# Patient Record
Sex: Female | Born: 1995 | Race: Black or African American | Hispanic: No | Marital: Single | Smoking: Current some day smoker
Health system: Southern US, Community
[De-identification: ages and names within clinical notes are randomized; demographics above are authoritative.]

## PROBLEM LIST (undated history)

## (undated) ENCOUNTER — Inpatient Hospital Stay (HOSPITAL_COMMUNITY): Payer: Self-pay

## (undated) DIAGNOSIS — R51 Headache: Secondary | ICD-10-CM

## (undated) HISTORY — PX: NO PAST SURGERIES: SHX2092

---
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
## 2017-05-23 ENCOUNTER — Encounter (HOSPITAL_COMMUNITY): Payer: Self-pay | Admitting: Emergency Medicine

## 2017-05-23 ENCOUNTER — Emergency Department (HOSPITAL_COMMUNITY)
Admission: EM | Admit: 2017-05-23 | Discharge: 2017-05-23 | Disposition: A | Discharge status: Home / Self Care | Payer: Medicaid Other | Attending: Emergency Medicine | Admitting: Emergency Medicine

## 2017-05-23 DIAGNOSIS — N939 Abnormal uterine and vaginal bleeding, unspecified: Secondary | ICD-10-CM

## 2017-05-23 DIAGNOSIS — F1290 Cannabis use, unspecified, uncomplicated: Secondary | ICD-10-CM | POA: Insufficient documentation

## 2017-05-23 LAB — I-STAT CHEM 8, ED
BUN: 10 mg/dL (ref 6–20)
CALCIUM ION: 1.15 mmol/L (ref 1.15–1.40)
Chloride: 103 mmol/L (ref 101–111)
Creatinine, Ser: 0.8 mg/dL (ref 0.44–1.00)
Glucose, Bld: 96 mg/dL (ref 65–99)
HCT: 36 % (ref 36.0–46.0)
Hemoglobin: 12.2 g/dL (ref 12.0–15.0)
Potassium: 3.8 mmol/L (ref 3.5–5.1)
SODIUM: 139 mmol/L (ref 135–145)
TCO2: 24 mmol/L (ref 0–100)

## 2017-05-23 LAB — CBC WITH DIFFERENTIAL/PLATELET
Basophils Absolute: 0 10*3/uL (ref 0.0–0.1)
Basophils Relative: 0 %
EOS ABS: 0.1 10*3/uL (ref 0.0–0.7)
EOS PCT: 1 %
HCT: 38.9 % (ref 36.0–46.0)
Hemoglobin: 12.9 g/dL (ref 12.0–15.0)
Lymphocytes Relative: 37 %
Lymphs Abs: 2.5 10*3/uL (ref 0.7–4.0)
MCH: 30.6 pg (ref 26.0–34.0)
MCHC: 33.2 g/dL (ref 30.0–36.0)
MCV: 92.2 fL (ref 78.0–100.0)
MONO ABS: 0.3 10*3/uL (ref 0.1–1.0)
Monocytes Relative: 5 %
Neutro Abs: 4 10*3/uL (ref 1.7–7.7)
Neutrophils Relative %: 57 %
PLATELETS: 165 10*3/uL (ref 150–400)
RBC: 4.22 MIL/uL (ref 3.87–5.11)
RDW: 12.6 % (ref 11.5–15.5)
WBC: 6.9 10*3/uL (ref 4.0–10.5)

## 2017-05-23 LAB — RPR: RPR: NONREACTIVE

## 2017-05-23 LAB — I-STAT BETA HCG BLOOD, ED (MC, WL, AP ONLY): I-stat hCG, quantitative: <5 m[IU]/mL (ref <5)

## 2017-05-23 LAB — HIV ANTIBODY (ROUTINE TESTING W REFLEX): HIV Screen 4th Generation wRfx: NONREACTIVE

## 2017-05-23 NOTE — ED Notes (Signed)
Pelvic exam completed.

## 2017-05-23 NOTE — ED Triage Notes (Signed)
Patient from home, states that she has been having vaginal bleeding since having her son in July 2017.  She states that it is a regular period, she has not had anytime since his birth that she has not had any bleeding.  She states that she is having some cramping.  States that the flow is normal.

## 2017-05-23 NOTE — ED Notes (Signed)
Patient states she is unable to urinate at this time. 

## 2017-05-23 NOTE — ED Provider Notes (Signed)
[facility] Provider Note   CSN: [id number] Arrival date & time: 05/23/17  [id number]     History   Chief Complaint Chief Complaint  Patient presents with  . Vaginal Bleeding    HPI Erica Todd is a 21 y.o. female.  HPI Patient presents with vaginal bleeding. She had her son 11 months ago and states she's had vaginal bleeding since. States it looks like a regular period. States she's been bleeding constantly but also states she does have a few days off in between. Occasional cramping. No pain. No lightheadedness or dizziness. She is not on any birth control. No fevers. No other bleeding.   Past Medical History:  Diagnosis Date  . JXBJYNWG(956.2Headache(784.0)     Patient Active Problem List   Diagnosis Date Noted  . Amniotic fluid leaking 06/26/2015  . Evaluate anatomy not seen on prior sonogram   . [redacted] weeks gestation of pregnancy   . Encounter for fetal anatomic survey   . [redacted] weeks gestation of pregnancy     Past Surgical History:  Procedure Laterality Date  . NO PAST SURGERIES      OB History    Gravida Para Term Preterm AB Living   2 1 1   1 1    SAB TAB Ectopic Multiple Live Births   1     0 1       Home Medications    Prior to Admission medications   Medication Sig Start Date End Date Taking? Authorizing Provider  ibuprofen (ADVIL,MOTRIN) 600 MG tablet Take 1 tablet (600 mg total) by mouth every 6 (six) hours. Patient not taking: Reported on 05/23/2017 06/28/15   Lynnae PrudeAllen, Daniel Landon, MD    Family History Family History  Problem Relation Age of Onset  . Alcohol abuse Neg Hx   . Arthritis Neg Hx   . Asthma Neg Hx   . Birth defects Neg Hx   . Cancer Neg Hx   . COPD Neg Hx   . Depression Neg Hx   . Diabetes Neg Hx   . Drug abuse Neg Hx   . Early death Neg Hx   . Hearing loss Neg Hx   . Heart disease Neg Hx   . Hyperlipidemia Neg Hx   . Hypertension Neg Hx   . Kidney disease Neg Hx   . Learning disabilities Neg Hx   . Mental illness Neg Hx   . Mental  retardation Neg Hx   . Miscarriages / Stillbirths Neg Hx   . Stroke Neg Hx   . Vision loss Neg Hx   . Varicose Veins Neg Hx     Social History Social History  Substance Use Topics  . Smoking status: Never Smoker  . Smokeless tobacco: Never Used  . Alcohol use No     Comment: one joint a day     Allergies   Patient has no known allergies.   Review of Systems Review of Systems  Constitutional: Negative for appetite change.  HENT: Negative for congestion.   Respiratory: Negative for shortness of breath.   Gastrointestinal: Negative for abdominal pain.  Genitourinary: Positive for vaginal bleeding. Negative for vaginal discharge and vaginal pain.  Musculoskeletal: Negative for back pain.  Neurological: Negative for seizures.  Psychiatric/Behavioral: Negative for confusion.     Physical Exam Updated Vital Signs BP 99/62   Pulse 60   Temp 98.4 F (36.9 C) (Oral)   Resp 18   LMP 05/04/2017   SpO2 100%   Physical Exam  Constitutional: She appears well-developed and well-nourished.  HENT:  Head: Atraumatic.  Eyes: EOM are normal.  Neck: Neck supple.  Cardiovascular:  Mild bradycardia.  Pulmonary/Chest: Effort normal.  Abdominal: Soft. There is no tenderness.  Genitourinary:  Genitourinary Comments: Pelvic exam showed no vaginal bleeding or abnormal vaginal discharge. No adnexal tenderness. No cervical motion tenderness.  Musculoskeletal: She exhibits no edema.  Neurological: She is alert.  Skin: Skin is warm. Capillary refill takes less than 2 seconds.  Psychiatric: She has a normal mood and affect.     ED Treatments / Results  Labs (all labs ordered are listed, but only abnormal results are displayed) Labs Reviewed  CBC WITH DIFFERENTIAL/PLATELET  RPR  HIV ANTIBODY (ROUTINE TESTING)  I-STAT CHEM 8, ED  I-STAT BETA HCG BLOOD, ED ([facility], [facility], [facility] ONLY)    EKG  EKG Interpretation None       Radiology No results found.  Procedures Procedures  (including critical care time)  Medications Ordered in ED Medications - No data to display   Initial Impression / Assessment and Plan / ED Course  I have reviewed the triage vital signs and the nursing notes.  Pertinent labs & imaging results that were available during my care of the patient were reviewed by me and considered in my medical decision making (see chart for details).     Patient    with somewhat persistent vaginal bleeding over the last year. Labs reassuring. Hemoglobin reassuring. Benign pelvic exam. Will have follow-up with GYN as needed.  Final Clinical Impressions(s) / ED Diagnoses   Final diagnoses:  Abnormal vaginal bleeding    New Prescriptions New Prescriptions   No medications on file     Benjiman Core, MD 05/23/17 1036

## 2017-05-23 NOTE — ED Notes (Signed)
Pelvic cart at bedside. 

## 2017-05-23 NOTE — ED Notes (Signed)
Patient informed we need urine specimen 

## 2018-12-19 ENCOUNTER — Encounter (HOSPITAL_COMMUNITY): Payer: Self-pay | Admitting: Emergency Medicine

## 2018-12-19 ENCOUNTER — Ambulatory Visit (HOSPITAL_COMMUNITY)
Admission: EM | Admit: 2018-12-19 | Discharge: 2018-12-19 | Disposition: A | Discharge status: Home / Self Care | Payer: Self-pay | Attending: Family Medicine | Admitting: Family Medicine

## 2018-12-19 DIAGNOSIS — N760 Acute vaginitis: Secondary | ICD-10-CM | POA: Insufficient documentation

## 2018-12-19 MED ORDER — METRONIDAZOLE 500 MG PO TABS: 500.0000 mg | ORAL_TABLET | Freq: Two times a day (BID) | ORAL | 0 refills | Status: DC

## 2018-12-19 NOTE — Discharge Instructions (Addendum)
We do metronidazole for possible bacterial vaginosis Your self swab was sent for testing we will call with any positive results.

## 2018-12-19 NOTE — ED Triage Notes (Signed)
Pt c/o vaginal discharge x3 days, states shes had BV before and feels like that.

## 2018-12-21 LAB — CERVICOVAGINAL ANCILLARY ONLY
Bacterial vaginitis: POSITIVE — AB
CANDIDA VAGINITIS: NEGATIVE
Chlamydia: NEGATIVE
Neisseria Gonorrhea: NEGATIVE
TRICH (WINDOWPATH): NEGATIVE

## 2018-12-21 NOTE — ED Provider Notes (Signed)
[facility]    CSN: [id number] Arrival date & time: 12/19/18  1211     History   Chief Complaint Chief Complaint  Patient presents with  . Vaginal Discharge    HPI Erica Todd is a 23 y.o. female.   Patient is a 23 year old female that presents today for vaginal discharge.  She reports that she has had bacterial vaginosis in the past and this feels similar.  She reports a discharge of milky white with odor.  She denies any associated fevers, chills, abdominal pain, back pain, pelvic pain.  Her symptoms have been constant and remain the same.  She has not done anything to treat her symptoms.  She is currently sexually active with one partner. Patient's last menstrual period was 12/09/2018.  She is not on any form of birth control  ROS per HPI       Past Medical History:  Diagnosis Date  . VOZDGUYQ(034.7Headache(784.0)     Patient Active Problem List   Diagnosis Date Noted  . Amniotic fluid leaking 06/26/2015  . Evaluate anatomy not seen on prior sonogram   . [redacted] weeks gestation of pregnancy   . Encounter for fetal anatomic survey   . [redacted] weeks gestation of pregnancy     Past Surgical History:  Procedure Laterality Date  . NO PAST SURGERIES      OB History    Gravida  2   Para  1   Term  1   Preterm      AB  1   Living  1     SAB  1   TAB      Ectopic      Multiple  0   Live Births  1            Home Medications    Prior to Admission medications   Medication Sig Start Date End Date Taking? Authorizing Provider  ibuprofen (ADVIL,MOTRIN) 600 MG tablet Take 1 tablet (600 mg total) by mouth every 6 (six) hours. Patient not taking: Reported on 05/23/2017 06/28/15   Lynnae PrudeAllen, Daniel Landon, MD  metroNIDAZOLE (FLAGYL) 500 MG tablet Take 1 tablet (500 mg total) by mouth 2 (two) times daily. 12/19/18   Janace ArisBast, Adalaya Irion A, NP    Family History Family History  Problem Relation Age of Onset  . Alcohol abuse Neg Hx   . Arthritis Neg Hx   . Asthma Neg Hx     . Birth defects Neg Hx   . Cancer Neg Hx   . COPD Neg Hx   . Depression Neg Hx   . Diabetes Neg Hx   . Drug abuse Neg Hx   . Early death Neg Hx   . Hearing loss Neg Hx   . Heart disease Neg Hx   . Hyperlipidemia Neg Hx   . Hypertension Neg Hx   . Kidney disease Neg Hx   . Learning disabilities Neg Hx   . Mental illness Neg Hx   . Mental retardation Neg Hx   . Miscarriages / Stillbirths Neg Hx   . Stroke Neg Hx   . Vision loss Neg Hx   . Varicose Veins Neg Hx     Social History Social History   Tobacco Use  . Smoking status: Never Smoker  . Smokeless tobacco: Never Used  Substance Use Topics  . Alcohol use: No    Comment: one joint a day  . Drug use: Yes    Frequency: 2.0 times per week  Types: Marijuana    Comment: occassionally     Allergies   Patient has no known allergies.   Review of Systems Review of Systems   Physical Exam Triage Vital Signs ED Triage Vitals  Enc Vitals Group     BP 12/19/18 1407 123/70     Pulse Rate 12/19/18 1407 89     Resp 12/19/18 1407 18     Temp 12/19/18 1407 98.2 F (36.8 C)     Temp src --      SpO2 12/19/18 1407 100 %     Weight --      Height --      Head Circumference --      Peak Flow --      Pain Score 12/19/18 1409 0     Pain Loc --      Pain Edu? --      Excl. in GC? --    No data found.  Updated Vital Signs BP 123/70   Pulse 89   Temp 98.2 F (36.8 C)   Resp 18   LMP 12/09/2018   SpO2 100%   Visual Acuity Right Eye Distance:   Left Eye Distance:   Bilateral Distance:    Right Eye Near:   Left Eye Near:    Bilateral Near:     Physical Exam Vitals signs and nursing note reviewed.  Constitutional:      General: She is not in acute distress.    Appearance: She is well-developed.  HENT:     Head: Normocephalic and atraumatic.  Eyes:     Conjunctiva/sclera: Conjunctivae normal.  Neck:     Musculoskeletal: Neck supple.  Cardiovascular:     Rate and Rhythm: Normal rate and regular  rhythm.     Heart sounds: No murmur.  Pulmonary:     Effort: Pulmonary effort is normal. No respiratory distress.     Breath sounds: Normal breath sounds.  Abdominal:     Palpations: Abdomen is soft.     Tenderness: There is no abdominal tenderness.  Genitourinary:    Comments: Deferred  Skin:    General: Skin is warm and dry.  Neurological:     Mental Status: She is alert.      UC Treatments / Results  Labs (all labs ordered are listed, but only abnormal results are displayed) Labs Reviewed  CERVICOVAGINAL ANCILLARY ONLY    EKG None  Radiology No results found.  Procedures Procedures (including critical care time)  Medications Ordered in UC Medications - No data to display  Initial Impression / Assessment and Plan / UC Course  I have reviewed the triage vital signs and the nursing notes.  Pertinent labs & imaging results that were available during my care of the patient were reviewed by me and considered in my medical decision making (see chart for details).     Based on patient's symptoms and history we will go ahead and treat for bacterial vaginosis today Self swab sent for testing Lab results pending.   Final Clinical Impressions(s) / UC Diagnoses   Final diagnoses:  Acute vaginitis     Discharge Instructions     We do metronidazole for possible bacterial vaginosis Your self swab was sent for testing we will call with any positive results.    ED Prescriptions    Medication Sig Dispense Auth. Provider   metroNIDAZOLE (FLAGYL) 500 MG tablet Take 1 tablet (500 mg total) by mouth 2 (two) times daily. 14 tablet Janace ArisBast, Greg Eckrich A, NP  Controlled Substance Prescriptions [place] Controlled Substance Registry consulted? no   Janace Aris, NP 12/21/18 [phone]

## 2019-02-04 ENCOUNTER — Encounter (HOSPITAL_COMMUNITY): Payer: Self-pay

## 2019-02-04 ENCOUNTER — Other Ambulatory Visit: Payer: Self-pay

## 2019-02-04 ENCOUNTER — Ambulatory Visit (HOSPITAL_COMMUNITY)
Admission: EM | Admit: 2019-02-04 | Discharge: 2019-02-04 | Disposition: A | Discharge status: Home / Self Care | Payer: Self-pay | Attending: Family Medicine | Admitting: Family Medicine

## 2019-02-04 DIAGNOSIS — N898 Other specified noninflammatory disorders of vagina: Secondary | ICD-10-CM | POA: Insufficient documentation

## 2019-02-04 MED ORDER — METRONIDAZOLE 500 MG PO TABS: 500.0000 mg | ORAL_TABLET | Freq: Two times a day (BID) | ORAL | 0 refills | Status: DC

## 2019-02-04 NOTE — ED Provider Notes (Signed)
[facility]   [id number] 02/04/19 Arrival Time: 2080  ASSESSMENT & PLAN:  1. Vaginal discharge    Declines vaginal cytology to test for gonorrhea and chlamydia secondary to cost. Prefers BV tx first and will f/u if not improving or worsening/new symptoms.   Discharge Instructions     You have been given the following medications today for treatment of suspected bacterial vaginosis:  Metronidazole  Even though we have treated you today, we have sent testing for sexually transmitted infections. We will notify you of any positive results once they are received. If required, we will prescribe any medications you might need.  Please refrain from all sexual activity for at least the next seven days.      Will notify of any positive results. Instructed to refrain from sexual activity for at least seven days.  Reviewed expectations re: course of current medical issues. Questions answered. Outlined signs and symptoms indicating need for more acute intervention. Patient verbalized understanding. After Visit Summary given.   SUBJECTIVE:  Erica Todd is a 23 y.o. female who presents with complaint of vaginal discharge. Onset abrupt. First noticed 2 d ago. Describes discharge as thick and green/yellow. Urinary symptoms: none. Afebrile. No abdominal or pelvic pain. No n/v. No rashes or lesions. Sexually active with single female partner. Seen here 12/19/2018; + for BV.  Patient's last menstrual period was 01/29/2019.  ROS: As per HPI.  OBJECTIVE:  Vitals:   02/04/19 0955 02/04/19 0956  BP:  (!) 103/58  Pulse:  75  Resp:  18  Temp:  98.1 F (36.7 C)  TempSrc:  Oral  SpO2:  98%  Weight: 61.2 kg     General appearance: alert, cooperative, appears stated age and no distress Throat: lips, mucosa, and tongue normal; teeth and gums normal CV: RRR Lungs: CTAB Back: no CVA tenderness; FROM at waist Abdomen: soft, non-tender GU: deferred Skin: warm and  dry Psychological: alert and cooperative; normal mood and affect.  No Known Allergies  Past Medical History:  Diagnosis Date  . Headache(784.0)    Family History  Problem Relation Age of Onset  . Alcohol abuse Neg Hx   . Arthritis Neg Hx   . Asthma Neg Hx   . Birth defects Neg Hx   . Cancer Neg Hx   . COPD Neg Hx   . Depression Neg Hx   . Diabetes Neg Hx   . Drug abuse Neg Hx   . Early death Neg Hx   . Hearing loss Neg Hx   . Heart disease Neg Hx   . Hyperlipidemia Neg Hx   . Hypertension Neg Hx   . Kidney disease Neg Hx   . Learning disabilities Neg Hx   . Mental illness Neg Hx   . Mental retardation Neg Hx   . Miscarriages / Stillbirths Neg Hx   . Stroke Neg Hx   . Vision loss Neg Hx   . Varicose Veins Neg Hx    Social History   Socioeconomic History  . Marital status: Single    Spouse name: Not on file  . Number of children: Not on file  . Years of education: Not on file  . Highest education level: Not on file  Occupational History  . Not on file  Social Needs  . Financial resource strain: Not on file  . Food insecurity:    Worry: Not on file    Inability: Not on file  . Transportation needs:    Medical: Not on  file    Non-medical: Not on file  Tobacco Use  . Smoking status: Never Smoker  . Smokeless tobacco: Never Used  Substance and Sexual Activity  . Alcohol use: No    Comment: one joint a day  . Drug use: Yes    Frequency: 2.0 times per week    Types: Marijuana    Comment: occassionally  . Sexual activity: Yes  Lifestyle  . Physical activity:    Days per week: Not on file    Minutes per session: Not on file  . Stress: Not on file  Relationships  . Social connections:    Talks on phone: Not on file    Gets together: Not on file    Attends religious service: Not on file    Active member of club or organization: Not on file    Attends meetings of clubs or organizations: Not on file    Relationship status: Not on file  . Intimate partner  violence:    Fear of current or ex partner: Not on file    Emotionally abused: Not on file    Physically abused: Not on file    Forced sexual activity: Not on file  Other Topics Concern  . Not on file  Social History Narrative  . Not on file         Mardella Layman, MD 02/04/19 1038

## 2019-02-04 NOTE — Discharge Instructions (Addendum)
You have been given the following medications today for treatment of suspected bacterial vaginosis:  Metronidazole  Even though we have treated you today, we have sent testing for sexually transmitted infections. We will notify you of any positive results once they are received. If required, we will prescribe any medications you might need.  Please refrain from all sexual activity for at least the next seven days.

## 2019-02-04 NOTE — ED Triage Notes (Signed)
Pt cc she has vaginal discharge x 2 days.

## 2019-02-04 NOTE — ED Triage Notes (Signed)
PT refused triage up front, will wait until she's in the back.

## 2019-02-05 LAB — CERVICOVAGINAL ANCILLARY ONLY
Bacterial vaginitis: POSITIVE — AB
CANDIDA VAGINITIS: NEGATIVE
CHLAMYDIA, DNA PROBE: NEGATIVE
NEISSERIA GONORRHEA: NEGATIVE
Trichomonas: NEGATIVE

## 2019-03-13 ENCOUNTER — Encounter (HOSPITAL_COMMUNITY): Payer: Self-pay | Admitting: Physician Assistant

## 2019-03-13 ENCOUNTER — Ambulatory Visit (HOSPITAL_COMMUNITY)
Admission: EM | Admit: 2019-03-13 | Discharge: 2019-03-13 | Disposition: A | Discharge status: Home / Self Care | Payer: Self-pay | Attending: Physician Assistant | Admitting: Physician Assistant

## 2019-03-13 ENCOUNTER — Other Ambulatory Visit: Payer: Self-pay

## 2019-03-13 DIAGNOSIS — N898 Other specified noninflammatory disorders of vagina: Secondary | ICD-10-CM

## 2019-03-13 MED ORDER — METRONIDAZOLE 500 MG PO TABS: 500.0000 mg | ORAL_TABLET | Freq: Two times a day (BID) | ORAL | 0 refills | Status: DC

## 2019-03-13 NOTE — ED Provider Notes (Signed)
[facility]    CSN: [id number] Arrival date & time: 03/13/19  1006     History   Chief Complaint Chief Complaint  Patient presents with  . Vaginal Discharge    HPI Erica Todd is a 23 y.o. female.   23 year old female comes in for few day history of vaginal discharge with odor.  Patient states she was diagnosed with BV, given Flagyl with good relief.  States she had unprotected intercourse prior to finishing treatment.  She denies vaginal bleeding, itching.  Denies fever, chills, night sweats.  Denies urinary symptoms such as frequency, dysuria, hematuria.  States has intermittent abdominal cramping, good relief after bowel movement. Denies nausea, vomiting. LMP 02/21/2019.     Past Medical History:  Diagnosis Date  . [id number](478.2Headache(784.0)     Patient Active Problem List   Diagnosis Date Noted  . Amniotic fluid leaking 06/26/2015  . Evaluate anatomy not seen on prior sonogram   . [redacted] weeks gestation of pregnancy   . Encounter for fetal anatomic survey   . [redacted] weeks gestation of pregnancy     Past Surgical History:  Procedure Laterality Date  . NO PAST SURGERIES      OB History    Gravida  2   Para  1   Term  1   Preterm      AB  1   Living  1     SAB  1   TAB      Ectopic      Multiple  0   Live Births  1            Home Medications    Prior to Admission medications   Medication Sig Start Date End Date Taking? Authorizing Provider  ibuprofen (ADVIL,MOTRIN) 600 MG tablet Take 1 tablet (600 mg total) by mouth every 6 (six) hours. Patient not taking: Reported on 05/23/2017 06/28/15   Lynnae PrudeAllen, Daniel Landon, MD  metroNIDAZOLE (FLAGYL) 500 MG tablet Take 1 tablet (500 mg total) by mouth 2 (two) times daily. 03/13/19   Belinda FisherYu, Safina Huard V, PA-C    Family History Family History  Problem Relation Age of Onset  . Alcohol abuse Neg Hx   . Arthritis Neg Hx   . Asthma Neg Hx   . Birth defects Neg Hx   . Cancer Neg Hx   . COPD Neg Hx   . Depression  Neg Hx   . Diabetes Neg Hx   . Drug abuse Neg Hx   . Early death Neg Hx   . Hearing loss Neg Hx   . Heart disease Neg Hx   . Hyperlipidemia Neg Hx   . Hypertension Neg Hx   . Kidney disease Neg Hx   . Learning disabilities Neg Hx   . Mental illness Neg Hx   . Mental retardation Neg Hx   . Miscarriages / Stillbirths Neg Hx   . Stroke Neg Hx   . Vision loss Neg Hx   . Varicose Veins Neg Hx     Social History Social History   Tobacco Use  . Smoking status: Never Smoker  . Smokeless tobacco: Never Used  Substance Use Topics  . Alcohol use: No    Comment: one joint a day  . Drug use: Yes    Frequency: 2.0 times per week    Types: Marijuana    Comment: occassionally     Allergies   Patient has no known allergies.   Review of Systems Review of Systems  Reason unable to perform ROS: See HPI as above.     Physical Exam Triage Vital Signs ED Triage Vitals  Enc Vitals Group     BP 03/13/19 1014 112/60     Pulse Rate 03/13/19 1014 60     Resp 03/13/19 1014 16     Temp 03/13/19 1014 98.9 F (37.2 C)     Temp Source 03/13/19 1014 Oral     SpO2 03/13/19 1014 100 %     Weight --      Height --      Head Circumference --      Peak Flow --      Pain Score 03/13/19 1016 0     Pain Loc --      Pain Edu? --      Excl. in GC? --    No data found.  Updated Vital Signs BP 112/60 (BP Location: Right Arm)   Pulse 60   Temp 98.9 F (37.2 C) (Oral)   Resp 16   LMP 02/21/2019   SpO2 100%   Physical Exam Constitutional:      General: She is not in acute distress.    Appearance: She is well-developed. She is not ill-appearing, toxic-appearing or diaphoretic.  HENT:     Head: Normocephalic and atraumatic.  Eyes:     Conjunctiva/sclera: Conjunctivae normal.     Pupils: Pupils are equal, round, and reactive to light.  Cardiovascular:     Rate and Rhythm: Normal rate and regular rhythm.     Heart sounds: Normal heart sounds. No murmur. No friction rub. No gallop.    Pulmonary:     Effort: Pulmonary effort is normal.     Breath sounds: Normal breath sounds. No wheezing or rales.  Abdominal:     General: Bowel sounds are normal.     Palpations: Abdomen is soft.     Tenderness: There is no abdominal tenderness. There is no right CVA tenderness, left CVA tenderness, guarding or rebound.  Skin:    General: Skin is warm and dry.  Neurological:     Mental Status: She is alert and oriented to person, place, and time.  Psychiatric:        Behavior: Behavior normal.        Judgment: Judgment normal.      UC Treatments / Results  Labs (all labs ordered are listed, but only abnormal results are displayed) Labs Reviewed - No data to display  EKG None  Radiology No results found.  Procedures Procedures (including critical care time)  Medications Ordered in UC Medications - No data to display  Initial Impression / Assessment and Plan / UC Course  I have reviewed the triage vital signs and the nursing notes.  Pertinent labs & imaging results that were available during my care of the patient were reviewed by me and considered in my medical decision making (see chart for details).    Patient was treated empirically for BV. Flagyl as directed.  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  Patient to refrain from sexual activity for the next 7 days. Return precautions given.   Final Clinical Impressions(s) / UC Diagnoses   Final diagnoses:  Vaginal discharge   ED Prescriptions    Medication Sig Dispense Auth. Provider   metroNIDAZOLE (FLAGYL) 500 MG tablet Take 1 tablet (500 mg total) by mouth 2 (two) times daily. 14 tablet Belinda Fisher, PA-C  Belinda Fisher, PA-C 03/13/19 1046

## 2019-03-13 NOTE — Discharge Instructions (Signed)
You were treated empirically for BV. Start flagyl as directed. Refrain from sexual activity and alcohol use for the next 7 days. Monitor for any worsening of symptoms, fever, abdominal pain, nausea, vomiting, to follow up for reevaluation. 

## 2019-03-13 NOTE — ED Notes (Signed)
Patient verbalizes understanding of discharge instructions. Opportunity for questioning and answers were provided. Patient discharged from [facility] by provider.  

## 2019-03-13 NOTE — ED Triage Notes (Signed)
Per pt she was dx here about 1 month a go with bacterial vaginosis and had sex again before treatment was completed and started having another smell and discharge. Some abdominal cramping. No Bleeding. No fevers

## 2019-04-04 ENCOUNTER — Other Ambulatory Visit: Payer: Self-pay

## 2019-04-04 ENCOUNTER — Emergency Department (HOSPITAL_COMMUNITY)
Admission: EM | Admit: 2019-04-04 | Discharge: 2019-04-04 | Discharge status: Against Medical Advice | Payer: Self-pay | Attending: Emergency Medicine | Admitting: Emergency Medicine

## 2019-04-04 ENCOUNTER — Encounter (HOSPITAL_COMMUNITY): Payer: Self-pay

## 2019-04-04 DIAGNOSIS — Z5321 Procedure and treatment not carried out due to patient leaving prior to being seen by health care provider: Secondary | ICD-10-CM | POA: Insufficient documentation

## 2019-04-04 LAB — URINALYSIS, ROUTINE W REFLEX MICROSCOPIC
Bilirubin Urine: NEGATIVE
Glucose, UA: NEGATIVE mg/dL
Hgb urine dipstick: NEGATIVE
Ketones, ur: NEGATIVE mg/dL
Leukocytes,Ua: NEGATIVE
Nitrite: NEGATIVE
Protein, ur: NEGATIVE mg/dL
Specific Gravity, Urine: 1.024 (ref 1.005–1.030)
pH: 6 (ref 5.0–8.0)

## 2019-04-04 MED ORDER — SODIUM CHLORIDE 0.9% FLUSH: 3.0000 mL | Freq: Once | INTRAVENOUS | Status: DC

## 2019-04-04 NOTE — ED Notes (Signed)
Pt refused to have labs drawn.

## 2019-04-04 NOTE — ED Triage Notes (Signed)
Onset earlier today LLQ abd pain, constant. Last BM yesterday-normal.  Usually has BM qd.

## 2019-12-03 NOTE — L&D Delivery Note (Signed)
Delivery Note At 2:22 AM a viable and healthy female was delivered via Vaginal, Spontaneous (Presentation:  Right Occiput Anterior).  APGAR: 9, 9; weight pending .   Placenta status: Spontaneous, Intact.  Cord: 3 vessels with the following complications: none .    Anesthesia: Epidural Episiotomy: None Lacerations: None Suture Repair: n/a Est. Blood Loss (mL): 50  Mom to postpartum.  Baby to Couplet care / Skin to Skin.  Erica Todd is a 24 y.o. female [phone] with IUP at [redacted]w[redacted]d admitted for IOL for IUGR.  She progressed with augmentation to complete and pushed less than 20 minutes to deliver.  Cord clamping delayed by 1-3 minutes then clamped by CNM and cut by  FOB.  Placenta intact and spontaneous, bleeding minimal.  Intact perineum without repair.  Mom and baby stable prior to transfer to postpartum. She plans on formula feeding. She is undecided about birth control.    [name] 08/06/2020, 3:04 AM

## 2020-03-16 LAB — OB RESULTS CONSOLE HIV ANTIBODY (ROUTINE TESTING): HIV: NONREACTIVE

## 2020-03-16 LAB — OB RESULTS CONSOLE GC/CHLAMYDIA
Chlamydia: NEGATIVE
Gonorrhea: NEGATIVE

## 2020-03-16 LAB — OB RESULTS CONSOLE HGB/HCT, BLOOD
HCT: 38 (ref 29–41)
Hemoglobin: 12.3

## 2020-03-16 LAB — CYTOLOGY - PAP: Pap: NEGATIVE

## 2020-03-16 LAB — CULTURE, OB URINE

## 2020-03-16 LAB — OB RESULTS CONSOLE PLATELET COUNT: Platelets: 284

## 2020-03-16 LAB — OB RESULTS CONSOLE RUBELLA ANTIBODY, IGM: Rubella: IMMUNE

## 2020-03-24 ENCOUNTER — Other Ambulatory Visit (HOSPITAL_COMMUNITY): Payer: Self-pay | Admitting: Obstetrics & Gynecology

## 2020-03-24 DIAGNOSIS — Z3A19 19 weeks gestation of pregnancy: Secondary | ICD-10-CM

## 2020-03-24 DIAGNOSIS — Z363 Encounter for antenatal screening for malformations: Secondary | ICD-10-CM

## 2020-03-29 ENCOUNTER — Ambulatory Visit (HOSPITAL_COMMUNITY): Payer: Self-pay

## 2020-03-29 ENCOUNTER — Ambulatory Visit (HOSPITAL_COMMUNITY): Admission: RE | Admit: 2020-03-29 | Payer: Self-pay | Source: Ambulatory Visit

## 2020-04-07 ENCOUNTER — Encounter: Payer: Self-pay | Admitting: *Deleted

## 2020-04-11 ENCOUNTER — Other Ambulatory Visit: Payer: Self-pay

## 2020-04-11 ENCOUNTER — Ambulatory Visit (HOSPITAL_COMMUNITY): Payer: Medicaid Other | Attending: Obstetrics & Gynecology

## 2020-04-11 ENCOUNTER — Other Ambulatory Visit: Payer: Self-pay | Admitting: *Deleted

## 2020-04-11 ENCOUNTER — Ambulatory Visit: Payer: Self-pay | Admitting: Genetic Counselor

## 2020-04-11 ENCOUNTER — Ambulatory Visit: Payer: Medicaid Other | Admitting: *Deleted

## 2020-04-11 ENCOUNTER — Ambulatory Visit (HOSPITAL_BASED_OUTPATIENT_CLINIC_OR_DEPARTMENT_OTHER): Payer: Medicaid Other | Admitting: Genetic Counselor

## 2020-04-11 VITALS — BP 119/65 | HR 72 | Temp 97.4°F

## 2020-04-11 DIAGNOSIS — Z3A19 19 weeks gestation of pregnancy: Secondary | ICD-10-CM | POA: Diagnosis present

## 2020-04-11 DIAGNOSIS — Z3A20 20 weeks gestation of pregnancy: Secondary | ICD-10-CM

## 2020-04-11 DIAGNOSIS — Z315 Encounter for genetic counseling: Secondary | ICD-10-CM

## 2020-04-11 DIAGNOSIS — Z362 Encounter for other antenatal screening follow-up: Secondary | ICD-10-CM

## 2020-04-11 DIAGNOSIS — Z363 Encounter for antenatal screening for malformations: Secondary | ICD-10-CM | POA: Insufficient documentation

## 2020-04-11 DIAGNOSIS — O0990 Supervision of high risk pregnancy, unspecified, unspecified trimester: Secondary | ICD-10-CM

## 2020-04-11 NOTE — Progress Notes (Signed)
04/11/2020  Erica Todd 11-25-1996 MRN: [id number] DOV: 04/11/2020  Erica Todd presented to the [facility] for a genetics consultation regarding advanced paternal age. Erica Todd came to her appointment alone due to COVID-19 visitor restrictions.   Indication for genetic counseling - Advanced paternal age  Prenatal history  Erica Todd is a [id number], 24 y.o. female. Her current pregnancy has completed [redacted]w[redacted]d (Estimated Date of Delivery: 08/26/20). Erica Todd has a healthy four year old son from a prior relationship. She also had a spontaneous miscarriage at 5 weeks' gestation with her son's father. Erica Todd had a previous elective termination of pregnancy with the same partner as the current fetus.  Erica Todd denied exposure to environmental toxins or chemical agents. She denied the use of alcohol, tobacco or street drugs. She denied significant viral illnesses, fevers, and bleeding during the course of her pregnancy. Her medical and surgical histories were noncontributory.  Family History  A three generation pedigree was drafted and reviewed. Both family histories were reviewed and found to be noncontributory for birth defects, intellectual disability, recurrent pregnancy loss, and known genetic conditions. Erica Todd had limited information about the father of the pregnancy's family history; thus, risk assessment was limited.  The patient's ethnicity is African American. The father of the pregnancy's ethnicity is African American. Ashkenazi Jewish ancestry and consanguinity were denied. Pedigree will be scanned under Media.  Discussion  Erica Todd was referred for genetic counseling due to advanced paternal age, as the father of the pregnancy will be 108 years old at the time of delivery.  Advanced paternal age:  Advanced paternal age (APA) is defined as greater than 38 years old at time of conception. We discussed that the chance of de novo (new) mutations  at the gene level in sperm cells increases with paternal age. For this reason, autosomal dominant single gene disorders are most strongly associated with APA. Fetuses of APA fathers have a 0.3-1% overall chance of being affected by a single gene disorder.  In addition to single gene disorders, APA is associated with an increased chance for obstetrical complications, such as hypertension, placental abruption, and preterm delivery. Some studies suggest that APA may also be associated with an increased risk for congenital anomalies, such as congenital heart defects. Schizophrenia, bipolar disorder, autism, and some forms of childhood cancer may also be associated with APA, though these risks are less well-understood Ella Jubilee et al., 2008).  We discussed an available screening option for some single gene disorders associated with APA called [facility]. [facility]ara utilizes noninvasive prenatal screening (NIPS) to screena pregnancyfor 25 autosomal or X-linked dominant single gene disorders. These include conditions associated with advanced paternal age, such as Noonan syndrome, skeletal dysplasias, and craniosynostoses. We briefly reviewed features of these conditions. [facility] is NOT diagnostic, and a positive result requires confirmation by CVS or amniocentesis. A negative test result does not rule out all single gene disorders. Erica Todd declined Javier Docker single gene NIPS today.  Ultrasound:  A complete ultrasound was performed today prior to our visit. The ultrasound report will be sent under separate cover. There were no visualized fetal anomalies or markers suggestive of aneuploidy. We discussed that fetuses with some of the single gene disorders associated with APA may demonstrate signs of the condition on ultrasound. However, ultrasound cannot identify all fetuses with these conditions. Erica Todd understands this limitation.  Carrier screening:  Per ACOG recommendation, carrier screening for cystic  fibrosis (CF), spinal muscular atrophy (SMA), and hemoglobinopathies was  discussed including information about the conditions, rationale for testing, autosomal recessive inheritance, and the option of prenatal diagnosis. Erica Todd previously had a negative hemoglobin electrophoresis and negative CF carrier screening, reducing her chances of being a carrier for a hemoglobinopathy such as sickle cell disease or a carrier of CF. I offered additional carrier screening for SMA, which Erica Todd declined at this time. Without carrier screening to refine risk and based on ethnicity alone, Erica Todd has a 1 in 3 chance of being a carrier for SMA. She was informed that select hemoglobinopathies, CF, and SMA are included on [place]'s newborn screen.   Diagnostic testing:  Erica Todd was also counseled regarding diagnostic testing via amniocentesis. We discussed the technical aspects of the procedure and quoted up to a 1 in 500 (0.2%) risk for spontaneous pregnancy loss or other adverse pregnancy outcomes as a result of amniocentesis. Cultured cells from an amniocentesis sample allow for the visualization of a fetal karyotype, which can detect >99% of chromosomal aberrations. Chromosomal microarray can also be performed to identify smaller deletions or duplications of fetal chromosomal material. After careful consideration, Erica Todd declined amniocentesis at this time. She understands that amniocentesis is available at any point after 16 weeks of pregnancy and that she may opt to undergo the procedure at a later date should she change her mind.  Plan:  Additional screening and diagnostic testing were declined today. Erica Todd understands that screening tests, including ultrasound, cannot rule out all birth defects or genetic syndromes.  I counseled Erica Todd regarding the above risks and available options. The approximate face-to-face time with the genetic counselor was 20 minutes.  In  summary:  Discussed associations with advanced paternal age and options for follow-up testing  Declined V[facility]ara single-gene NIPS  Discussed carrier screening results  Negative carrier screening for hemoglobinopathies and cystic fibrosis  Declined carrier screening for spinal muscular atrophy  Reviewed results of ultrasound  No fetal anomalies or markers seen  Reduction in risk for fetal aneuploidy  Offered additional testing and screening  Declined amniocentesis  Reviewed family history concerns   Buelah Manis, MS, Counselling psychologist

## 2020-05-10 ENCOUNTER — Ambulatory Visit: Payer: Medicaid Other | Attending: Obstetrics and Gynecology

## 2020-05-10 ENCOUNTER — Other Ambulatory Visit: Payer: Self-pay | Admitting: *Deleted

## 2020-05-10 ENCOUNTER — Ambulatory Visit: Payer: Medicaid Other | Admitting: *Deleted

## 2020-05-10 ENCOUNTER — Other Ambulatory Visit: Payer: Self-pay

## 2020-05-10 VITALS — BP 106/61 | HR 71

## 2020-05-10 DIAGNOSIS — Z362 Encounter for other antenatal screening follow-up: Secondary | ICD-10-CM | POA: Diagnosis not present

## 2020-05-10 DIAGNOSIS — Z3A24 24 weeks gestation of pregnancy: Secondary | ICD-10-CM

## 2020-05-10 DIAGNOSIS — Z315 Encounter for genetic counseling: Secondary | ICD-10-CM

## 2020-06-08 ENCOUNTER — Other Ambulatory Visit: Payer: Self-pay

## 2020-06-08 ENCOUNTER — Ambulatory Visit: Payer: Medicaid Other | Attending: Obstetrics and Gynecology

## 2020-06-08 ENCOUNTER — Other Ambulatory Visit: Payer: Self-pay | Admitting: *Deleted

## 2020-06-08 ENCOUNTER — Other Ambulatory Visit: Payer: Self-pay | Admitting: Obstetrics

## 2020-06-08 ENCOUNTER — Ambulatory Visit: Payer: Medicaid Other | Admitting: *Deleted

## 2020-06-08 VITALS — BP 109/59 | HR 66

## 2020-06-08 DIAGNOSIS — Z362 Encounter for other antenatal screening follow-up: Secondary | ICD-10-CM

## 2020-06-08 DIAGNOSIS — O365990 Maternal care for other known or suspected poor fetal growth, unspecified trimester, not applicable or unspecified: Secondary | ICD-10-CM

## 2020-06-08 DIAGNOSIS — O365931 Maternal care for other known or suspected poor fetal growth, third trimester, fetus 1: Secondary | ICD-10-CM

## 2020-06-15 ENCOUNTER — Other Ambulatory Visit: Payer: Medicaid Other

## 2020-06-15 ENCOUNTER — Ambulatory Visit: Payer: Medicaid Other

## 2020-06-15 ENCOUNTER — Ambulatory Visit: Payer: Medicaid Other | Attending: Obstetrics and Gynecology

## 2020-06-22 ENCOUNTER — Ambulatory Visit: Payer: Medicaid Other | Attending: Obstetrics and Gynecology

## 2020-06-22 ENCOUNTER — Ambulatory Visit: Payer: Medicaid Other | Admitting: *Deleted

## 2020-06-22 ENCOUNTER — Other Ambulatory Visit: Payer: Self-pay

## 2020-06-22 VITALS — BP 123/70 | HR 66

## 2020-06-22 DIAGNOSIS — O365930 Maternal care for other known or suspected poor fetal growth, third trimester, not applicable or unspecified: Secondary | ICD-10-CM | POA: Diagnosis not present

## 2020-06-22 DIAGNOSIS — Z362 Encounter for other antenatal screening follow-up: Secondary | ICD-10-CM | POA: Insufficient documentation

## 2020-06-22 DIAGNOSIS — O365990 Maternal care for other known or suspected poor fetal growth, unspecified trimester, not applicable or unspecified: Secondary | ICD-10-CM | POA: Insufficient documentation

## 2020-06-22 DIAGNOSIS — Z315 Encounter for genetic counseling: Secondary | ICD-10-CM | POA: Diagnosis not present

## 2020-06-22 DIAGNOSIS — Z3A30 30 weeks gestation of pregnancy: Secondary | ICD-10-CM

## 2020-06-22 DIAGNOSIS — O365931 Maternal care for other known or suspected poor fetal growth, third trimester, fetus 1: Secondary | ICD-10-CM | POA: Diagnosis not present

## 2020-06-22 NOTE — Procedures (Signed)
Erica Todd 1996-10-22 [redacted]w[redacted]d  Fetus A Non-Stress Test Interpretation for 06/22/20  Indication: IUGR  Fetal Heart Rate A Mode: External Baseline Rate (A): 140 bpm Variability: Moderate Accelerations: 10 x 10 Decelerations: None  Uterine Activity Mode: Toco Contraction Frequency (min): none noted  Interpretation (Fetal Testing) Nonstress Test Interpretation: Reactive Comments: FHR tracing rev'd by Dr. Grace Bushy

## 2020-06-29 ENCOUNTER — Ambulatory Visit: Payer: Medicaid Other

## 2020-06-29 ENCOUNTER — Ambulatory Visit: Payer: Medicaid Other | Attending: Obstetrics and Gynecology

## 2020-07-06 ENCOUNTER — Ambulatory Visit: Payer: Medicaid Other | Admitting: *Deleted

## 2020-07-06 ENCOUNTER — Other Ambulatory Visit: Payer: Self-pay | Admitting: Obstetrics

## 2020-07-06 ENCOUNTER — Ambulatory Visit: Payer: Medicaid Other | Attending: Obstetrics and Gynecology

## 2020-07-06 ENCOUNTER — Other Ambulatory Visit: Payer: Self-pay

## 2020-07-06 VITALS — BP 116/67 | HR 68

## 2020-07-06 DIAGNOSIS — Z315 Encounter for genetic counseling: Secondary | ICD-10-CM

## 2020-07-06 DIAGNOSIS — O365990 Maternal care for other known or suspected poor fetal growth, unspecified trimester, not applicable or unspecified: Secondary | ICD-10-CM

## 2020-07-06 DIAGNOSIS — Z3A32 32 weeks gestation of pregnancy: Secondary | ICD-10-CM

## 2020-07-06 DIAGNOSIS — O365930 Maternal care for other known or suspected poor fetal growth, third trimester, not applicable or unspecified: Secondary | ICD-10-CM | POA: Diagnosis not present

## 2020-07-06 DIAGNOSIS — Z362 Encounter for other antenatal screening follow-up: Secondary | ICD-10-CM

## 2020-07-07 ENCOUNTER — Other Ambulatory Visit: Payer: Self-pay | Admitting: *Deleted

## 2020-07-07 DIAGNOSIS — O365990 Maternal care for other known or suspected poor fetal growth, unspecified trimester, not applicable or unspecified: Secondary | ICD-10-CM

## 2020-07-12 ENCOUNTER — Ambulatory Visit: Payer: Medicaid Other | Attending: Obstetrics and Gynecology

## 2020-07-12 ENCOUNTER — Other Ambulatory Visit: Payer: Self-pay | Admitting: Obstetrics

## 2020-07-12 ENCOUNTER — Other Ambulatory Visit: Payer: Self-pay | Admitting: Obstetrics & Gynecology

## 2020-07-12 ENCOUNTER — Ambulatory Visit: Payer: Medicaid Other | Admitting: *Deleted

## 2020-07-12 ENCOUNTER — Other Ambulatory Visit: Payer: Self-pay

## 2020-07-12 VITALS — BP 116/69 | HR 66

## 2020-07-12 DIAGNOSIS — O365920 Maternal care for other known or suspected poor fetal growth, second trimester, not applicable or unspecified: Secondary | ICD-10-CM | POA: Diagnosis present

## 2020-07-12 DIAGNOSIS — O365990 Maternal care for other known or suspected poor fetal growth, unspecified trimester, not applicable or unspecified: Secondary | ICD-10-CM

## 2020-07-12 DIAGNOSIS — Z315 Encounter for genetic counseling: Secondary | ICD-10-CM

## 2020-07-12 DIAGNOSIS — Z362 Encounter for other antenatal screening follow-up: Secondary | ICD-10-CM

## 2020-07-12 DIAGNOSIS — Z3A33 33 weeks gestation of pregnancy: Secondary | ICD-10-CM | POA: Diagnosis not present

## 2020-07-12 DIAGNOSIS — O365930 Maternal care for other known or suspected poor fetal growth, third trimester, not applicable or unspecified: Secondary | ICD-10-CM | POA: Diagnosis not present

## 2020-07-12 NOTE — Procedures (Signed)
Erica Todd [birth year] [redacted]w[redacted]d  Fetus A Non-Stress Test Interpretation for 07/12/20  Indication: IUGR  Fetal Heart Rate A Mode: External Baseline Rate (A): 140 bpm Variability: Moderate Accelerations: 15 x 15 Decelerations: None Multiple birth?: No  Uterine Activity Mode: Palpation, Toco Contraction Frequency (min): none noted Resting Tone Palpated: Relaxed Resting Time: Adequate  Interpretation (Fetal Testing) Nonstress Test Interpretation: Reactive Comments: FHR tracing rev'd with Dr. Parke Poisson

## 2020-07-19 ENCOUNTER — Ambulatory Visit: Payer: Medicaid Other

## 2020-07-19 ENCOUNTER — Ambulatory Visit: Payer: Medicaid Other | Attending: Obstetrics and Gynecology

## 2020-07-19 ENCOUNTER — Other Ambulatory Visit: Payer: Self-pay | Admitting: Obstetrics and Gynecology

## 2020-07-19 DIAGNOSIS — O365990 Maternal care for other known or suspected poor fetal growth, unspecified trimester, not applicable or unspecified: Secondary | ICD-10-CM

## 2020-07-24 ENCOUNTER — Encounter: Payer: Self-pay | Admitting: *Deleted

## 2020-07-26 ENCOUNTER — Ambulatory Visit: Payer: Medicaid Other | Attending: Obstetrics and Gynecology

## 2020-07-26 ENCOUNTER — Other Ambulatory Visit: Payer: Self-pay

## 2020-07-26 ENCOUNTER — Ambulatory Visit: Payer: Medicaid Other | Admitting: *Deleted

## 2020-07-26 ENCOUNTER — Other Ambulatory Visit: Payer: Self-pay | Admitting: Obstetrics and Gynecology

## 2020-07-26 VITALS — BP 119/69 | HR 71

## 2020-07-26 DIAGNOSIS — Z3A35 35 weeks gestation of pregnancy: Secondary | ICD-10-CM

## 2020-07-26 DIAGNOSIS — O365990 Maternal care for other known or suspected poor fetal growth, unspecified trimester, not applicable or unspecified: Secondary | ICD-10-CM | POA: Insufficient documentation

## 2020-07-26 DIAGNOSIS — O365930 Maternal care for other known or suspected poor fetal growth, third trimester, not applicable or unspecified: Secondary | ICD-10-CM

## 2020-07-26 DIAGNOSIS — Z362 Encounter for other antenatal screening follow-up: Secondary | ICD-10-CM

## 2020-07-26 DIAGNOSIS — Z315 Encounter for genetic counseling: Secondary | ICD-10-CM

## 2020-07-26 NOTE — Procedures (Signed)
Erica Todd Nov 18, 1996 [redacted]w[redacted]d  Fetus A Non-Stress Test Interpretation for 07/26/20  Indication: IUGR  Fetal Heart Rate A Mode: External Baseline Rate (A): 140 bpm Variability: Moderate Accelerations: 15 x 15 Decelerations: Variable Multiple birth?: No  Uterine Activity Mode: Palpation, Toco Contraction Frequency (min): none noted Resting Tone Palpated: Relaxed Resting Time: Adequate  Interpretation (Fetal Testing) Nonstress Test Interpretation: Reactive Comments: FHR tracing reviewed with Dr. Judeth Cornfield

## 2020-07-28 ENCOUNTER — Encounter: Payer: Self-pay | Admitting: Obstetrics and Gynecology

## 2020-07-28 ENCOUNTER — Other Ambulatory Visit (HOSPITAL_COMMUNITY)
Admission: RE | Admit: 2020-07-28 | Discharge: 2020-07-28 | Disposition: A | Discharge status: Home / Self Care | Payer: Medicaid Other | Source: Ambulatory Visit | Attending: Obstetrics and Gynecology | Admitting: Obstetrics and Gynecology

## 2020-07-28 ENCOUNTER — Other Ambulatory Visit: Payer: Self-pay

## 2020-07-28 ENCOUNTER — Ambulatory Visit (INDEPENDENT_AMBULATORY_CARE_PROVIDER_SITE_OTHER): Payer: Medicaid Other | Admitting: Obstetrics and Gynecology

## 2020-07-28 DIAGNOSIS — O0990 Supervision of high risk pregnancy, unspecified, unspecified trimester: Secondary | ICD-10-CM | POA: Diagnosis not present

## 2020-07-28 DIAGNOSIS — O0993 Supervision of high risk pregnancy, unspecified, third trimester: Secondary | ICD-10-CM | POA: Diagnosis not present

## 2020-07-28 DIAGNOSIS — Z87898 Personal history of other specified conditions: Secondary | ICD-10-CM | POA: Insufficient documentation

## 2020-07-28 DIAGNOSIS — Z3A35 35 weeks gestation of pregnancy: Secondary | ICD-10-CM

## 2020-07-28 DIAGNOSIS — O99333 Smoking (tobacco) complicating pregnancy, third trimester: Secondary | ICD-10-CM

## 2020-07-28 DIAGNOSIS — Z8619 Personal history of other infectious and parasitic diseases: Secondary | ICD-10-CM | POA: Diagnosis not present

## 2020-07-28 DIAGNOSIS — O365930 Maternal care for other known or suspected poor fetal growth, third trimester, not applicable or unspecified: Secondary | ICD-10-CM | POA: Diagnosis not present

## 2020-07-28 MED ORDER — VALACYCLOVIR HCL 500 MG PO TABS: 500.0000 mg | ORAL_TABLET | Freq: Every day | ORAL | 12 refills | Status: DC

## 2020-07-28 NOTE — Progress Notes (Signed)
   Subjective:    Erica Todd is a [id number] [redacted]w[redacted]d being seen today for her first obstetrical visit. Patient is transferring care form health department due to IUGR. Her obstetrical history is significant for intrauterine growth restriction (IUGR) and smoker. Patient does intend to breast feed. Pregnancy history fully reviewed.  Patient reports no complaints.  Vitals:   07/28/20 0923  BP: 112/64  Pulse: 64    HISTORY: OB History  Gravida Para Term Preterm AB Living  4 1 1  0 2 1  SAB TAB Ectopic Multiple Live Births  1 1 0 0 1    # Outcome Date GA Lbr Len/2nd Weight Sex Delivery Anes PTL Lv  4 Current           3 Term 06/26/15 [redacted]w[redacted]d 11:21 / 00:32 5 lb 8.5 oz (2.509 kg) M Vag-Spont EPI  LIV  2 TAB           1 SAB              Birth Comments: System Generated. Please review and update pregnancy details.   Past Medical History:  Diagnosis Date  . [redacted]w[redacted]d)    Past Surgical History:  Procedure Laterality Date  . NO PAST SURGERIES     Family History  Problem Relation Age of Onset  . Alcohol abuse Neg Hx   . Arthritis Neg Hx   . Asthma Neg Hx   . Birth defects Neg Hx   . Cancer Neg Hx   . COPD Neg Hx   . Depression Neg Hx   . Diabetes Neg Hx   . Drug abuse Neg Hx   . Early death Neg Hx   . Hearing loss Neg Hx   . Heart disease Neg Hx   . Hyperlipidemia Neg Hx   . Hypertension Neg Hx   . Kidney disease Neg Hx   . Learning disabilities Neg Hx   . Mental illness Neg Hx   . Mental retardation Neg Hx   . Miscarriages / Stillbirths Neg Hx   . Stroke Neg Hx   . Vision loss Neg Hx   . Varicose Veins Neg Hx      Exam    Uterus:  Fundal Height: 34 cm  Pelvic Exam: declined      Assessment:    Pregnancy: VOHYWVPX(106.2 Patient Active Problem List   Diagnosis Date Noted  . Supervision of high risk pregnancy, antepartum 07/28/2020  . Tobacco smoking affecting pregnancy in third trimester 07/28/2020  . IUGR (intrauterine growth restriction) affecting care of  mother 07/28/2020  . History of herpes genitalis 07/28/2020        Plan:     Initial labs reviewed. Prenatal vitamins. Problem list reviewed and updated. Genetic Screening discussed Quad Screen: results reviewed.  Ultrasound discussed; fetal survey: results reviewed. Rx valtrex provided for suppression Cultures collected IOL scheduled at 37 weeks Patient undecided on contraception Patient declined tdap  Follow up in 1 weeks. 50% of 20 min visit spent on counseling and coordination of care.     Erica Todd 07/28/2020

## 2020-07-30 ENCOUNTER — Other Ambulatory Visit: Payer: Self-pay | Admitting: Advanced Practice Midwife

## 2020-07-31 ENCOUNTER — Encounter (HOSPITAL_COMMUNITY): Payer: Self-pay | Admitting: *Deleted

## 2020-07-31 ENCOUNTER — Other Ambulatory Visit: Payer: Self-pay | Admitting: Obstetrics and Gynecology

## 2020-07-31 ENCOUNTER — Telehealth (HOSPITAL_COMMUNITY): Payer: Self-pay | Admitting: *Deleted

## 2020-07-31 LAB — CERVICOVAGINAL ANCILLARY ONLY
Chlamydia: NEGATIVE
Comment: NEGATIVE
Comment: NORMAL
Neisseria Gonorrhea: NEGATIVE

## 2020-07-31 NOTE — Telephone Encounter (Signed)
Preadmission screen  

## 2020-08-01 LAB — CULTURE, BETA STREP (GROUP B ONLY): Strep Gp B Culture: NEGATIVE

## 2020-08-02 ENCOUNTER — Ambulatory Visit: Payer: Medicaid Other

## 2020-08-02 ENCOUNTER — Ambulatory Visit: Payer: Medicaid Other | Attending: Obstetrics and Gynecology

## 2020-08-02 ENCOUNTER — Other Ambulatory Visit: Payer: Self-pay | Admitting: Advanced Practice Midwife

## 2020-08-03 ENCOUNTER — Other Ambulatory Visit (HOSPITAL_COMMUNITY)
Admission: RE | Admit: 2020-08-03 | Discharge: 2020-08-03 | Disposition: A | Discharge status: Home / Self Care | Payer: Medicaid Other | Source: Ambulatory Visit | Attending: Obstetrics and Gynecology | Admitting: Obstetrics and Gynecology

## 2020-08-03 ENCOUNTER — Encounter: Payer: Medicaid Other | Admitting: Obstetrics and Gynecology

## 2020-08-03 DIAGNOSIS — Z01818 Encounter for other preprocedural examination: Secondary | ICD-10-CM | POA: Insufficient documentation

## 2020-08-03 DIAGNOSIS — Z20822 Contact with and (suspected) exposure to covid-19: Secondary | ICD-10-CM | POA: Insufficient documentation

## 2020-08-03 LAB — SARS CORONAVIRUS 2 (TAT 6-24 HRS): SARS Coronavirus 2: NEGATIVE

## 2020-08-04 ENCOUNTER — Encounter: Payer: Medicaid Other | Admitting: Family Medicine

## 2020-08-05 ENCOUNTER — Inpatient Hospital Stay (HOSPITAL_COMMUNITY)
Admission: AD | Admit: 2020-08-05 | Discharge: 2020-08-08 | DRG: 806 | Disposition: A | Discharge status: Home / Self Care | Payer: Medicaid Other | Attending: Obstetrics and Gynecology | Admitting: Obstetrics and Gynecology

## 2020-08-05 ENCOUNTER — Other Ambulatory Visit: Payer: Self-pay

## 2020-08-05 ENCOUNTER — Encounter (HOSPITAL_COMMUNITY): Payer: Self-pay | Admitting: Obstetrics and Gynecology

## 2020-08-05 ENCOUNTER — Inpatient Hospital Stay (HOSPITAL_COMMUNITY): Payer: Medicaid Other

## 2020-08-05 DIAGNOSIS — F1290 Cannabis use, unspecified, uncomplicated: Secondary | ICD-10-CM | POA: Diagnosis present

## 2020-08-05 DIAGNOSIS — Z3A37 37 weeks gestation of pregnancy: Secondary | ICD-10-CM | POA: Diagnosis not present

## 2020-08-05 DIAGNOSIS — O99324 Drug use complicating childbirth: Secondary | ICD-10-CM | POA: Diagnosis present

## 2020-08-05 DIAGNOSIS — O365930 Maternal care for other known or suspected poor fetal growth, third trimester, not applicable or unspecified: Secondary | ICD-10-CM | POA: Diagnosis present

## 2020-08-05 DIAGNOSIS — Z3490 Encounter for supervision of normal pregnancy, unspecified, unspecified trimester: Secondary | ICD-10-CM | POA: Diagnosis present

## 2020-08-05 LAB — CBC
HCT: 36 % (ref 36.0–46.0)
Hemoglobin: 12.2 g/dL (ref 12.0–15.0)
MCH: 32.1 pg (ref 26.0–34.0)
MCHC: 33.9 g/dL (ref 30.0–36.0)
MCV: 94.7 fL (ref 80.0–100.0)
Platelets: 213 10*3/uL (ref 150–400)
RBC: 3.8 MIL/uL — ABNORMAL LOW (ref 3.87–5.11)
RDW: 13.5 % (ref 11.5–15.5)
WBC: 9.9 10*3/uL (ref 4.0–10.5)
nRBC: 0 % (ref 0.0–0.2)

## 2020-08-05 LAB — RPR: RPR Ser Ql: NONREACTIVE

## 2020-08-05 LAB — TYPE AND SCREEN
ABO/RH(D): O POS
Antibody Screen: NEGATIVE

## 2020-08-05 MED ORDER — ACETAMINOPHEN 325 MG PO TABS: 650.0000 mg | ORAL_TABLET | ORAL | Status: DC | PRN

## 2020-08-05 MED ORDER — MISOPROSTOL 100 MCG PO TABS
25.0000 ug | ORAL_TABLET | ORAL | Status: DC
  Administered 2020-08-05: 25 ug via VAGINAL
  Filled 2020-08-05: qty 1

## 2020-08-05 MED ORDER — MISOPROSTOL 50MCG HALF TABLET
ORAL_TABLET | ORAL | Status: AC
  Filled 2020-08-05: qty 1

## 2020-08-05 MED ORDER — OXYTOCIN-SODIUM CHLORIDE 30-0.9 UT/500ML-% IV SOLN
1.0000 m[IU]/min | INTRAVENOUS | Status: DC
  Administered 2020-08-05: 2 m[IU]/min via INTRAVENOUS

## 2020-08-05 MED ORDER — LACTATED RINGERS IV SOLN: 500.0000 mL | INTRAVENOUS | Status: DC | PRN

## 2020-08-05 MED ORDER — ONDANSETRON HCL 4 MG/2ML IJ SOLN
4.0000 mg | Freq: Four times a day (QID) | INTRAMUSCULAR | Status: DC | PRN
  Administered 2020-08-05: 4 mg via INTRAVENOUS
  Filled 2020-08-05: qty 2

## 2020-08-05 MED ORDER — SOD CITRATE-CITRIC ACID 500-334 MG/5ML PO SOLN: 30.0000 mL | ORAL | Status: DC | PRN

## 2020-08-05 MED ORDER — OXYTOCIN BOLUS FROM INFUSION
333.0000 mL | Freq: Once | INTRAVENOUS | Status: AC
  Administered 2020-08-06: 333 mL via INTRAVENOUS

## 2020-08-05 MED ORDER — LACTATED RINGERS IV SOLN: INTRAVENOUS | Status: DC

## 2020-08-05 MED ORDER — MISOPROSTOL 50MCG HALF TABLET
50.0000 ug | ORAL_TABLET | Freq: Once | ORAL | Status: AC
  Administered 2020-08-05: 50 ug via ORAL

## 2020-08-05 MED ORDER — OXYCODONE-ACETAMINOPHEN 5-325 MG PO TABS: 2.0000 | ORAL_TABLET | ORAL | Status: DC | PRN

## 2020-08-05 MED ORDER — MISOPROSTOL 25 MCG QUARTER TABLET
ORAL_TABLET | ORAL | Status: AC
  Administered 2020-08-05: 25 ug
  Filled 2020-08-05: qty 1

## 2020-08-05 MED ORDER — OXYCODONE-ACETAMINOPHEN 5-325 MG PO TABS: 1.0000 | ORAL_TABLET | ORAL | Status: DC | PRN

## 2020-08-05 MED ORDER — OXYTOCIN-SODIUM CHLORIDE 30-0.9 UT/500ML-% IV SOLN
2.5000 [IU]/h | INTRAVENOUS | Status: DC
  Filled 2020-08-05: qty 500

## 2020-08-05 MED ORDER — LIDOCAINE HCL (PF) 1 % IJ SOLN: 30.0000 mL | INTRAMUSCULAR | Status: DC | PRN

## 2020-08-05 MED ORDER — TERBUTALINE SULFATE 1 MG/ML IJ SOLN: 0.2500 mg | Freq: Once | INTRAMUSCULAR | Status: DC | PRN

## 2020-08-05 NOTE — H&P (Addendum)
OBSTETRIC ADMISSION HISTORY AND PHYSICAL  Erica Todd is a 24 y.o. female [id number] with IUP at [redacted]w[redacted]d dated by 20w U/S presenting for IOL due to IUGR. She reports +FMs, No LOF, no VB, no blurry vision, headaches or peripheral edema, and RUQ pain.  She plans on breast feeding. She undecided for birth control. She received her prenatal care at [facility], transferred from HD at 35 weeks.   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  --THC use   --Appears to have tobacco use within chart, however patient declines this today  --Advanced paternal age (24 years old)   Past Medical History: Past Medical History:  Diagnosis Date   Headache(784.0)     Past Surgical History: Past Surgical History:  Procedure Laterality Date   NO PAST SURGERIES      Obstetrical History: OB History     Gravida  4   Para  1   Term  1   Preterm  0   AB  2   Living  1      SAB  1   TAB  1   Ectopic  0   Multiple  0   Live Births  1           Social History Social History   Socioeconomic History   Marital status: Single    Spouse name: Not on file   Number of children: Not on file   Years of education: Not on file   Highest education level: Not on file  Occupational History   Not on file  Tobacco Use   Smoking status: Never Smoker   Smokeless tobacco: Never Used  Vaping Use   Vaping Use: Never used  Substance and Sexual Activity   Alcohol use: No   Drug use: Yes    Frequency: 2.0 times per week    Types: Marijuana    Comment: occassionally   Sexual activity: Yes  Other  Topics Concern   Not on file  Social History Narrative   Not on file   Social Determinants of Health   Financial Resource Strain:    Difficulty of Paying Living Expenses: Not on file  Food Insecurity: No Food Insecurity   Worried About Running Out of Food in the Last Year: Never true   Ran Out of Food in the Last Year: Never true  Transportation Needs: No Transportation Needs   Lack of Transportation (Medical): No   Lack of Transportation (Non-Medical): No  Physical Activity:    Days of Exercise per Week: Not on file   Minutes of Exercise per Session: Not on file  Stress:    Feeling of Stress : Not on file  Social Connections:    Frequency of Communication with Friends and Family: Not on file   Frequency of Social Gatherings with Friends and Family: Not on file   Attends Religious Services: Not on file   Active Member of Clubs or Organizations: Not on file   Attends 67 Meetings: Not on file   Marital Status: Not on file    Family History:  Family History  Problem Relation Age of Onset   Alcohol abuse Neg Hx    Arthritis Neg Hx    Asthma Neg Hx    Birth defects Neg Hx    Cancer Neg Hx    COPD Neg Hx    Depression Neg Hx    Diabetes Neg Hx    Drug abuse Neg Hx    Early death Neg Hx    Hearing loss Neg Hx    Heart disease Neg Hx    Hyperlipidemia Neg Hx    Hypertension Neg Hx    Kidney disease Neg Hx    Learning disabilities Neg Hx    Mental illness Neg Hx    Mental retardation Neg Hx    Miscarriages / Stillbirths Neg Hx    Stroke Neg Hx    Vision loss Neg Hx    Varicose Veins Neg Hx     Allergies: No Known Allergies  Medications Prior to Admission  Medication Sig Dispense Refill Last Dose   Prenatal Vit-Fe Fumarate-FA (PRENATAL VITAMIN PO) Take by mouth.   Past Week at Unknown time   valACYclovir (VALTREX) 500 MG tablet Take 1 tablet (500 mg total) by mouth daily. Can increase to twice a day for 5 days in the event of a recurrence 30 tablet  12      Review of Systems   All systems reviewed and negative except as stated in HPI  Last menstrual period 11/28/2019, unknown if currently breastfeeding. General appearance: alert, cooperative and no distress Lungs: clear to auscultation bilaterally Heart: regular rate and rhythm Abdomen: soft, non-tender; bowel sounds normal Pelvic exam: VULVA: normal appearing vulva with no masses or herpetic lesions, VAGINA: normal appearing vagina via speculum with normal color, no herpetic lesions. Extremities: Homans sign is negative, no sign of DVT Presentation: cephalic Fetal monitoringBaseline: 140 bpm, Variability: Good {> 6 bpm), Accelerations: Reactive and Decelerations: Absent Uterine activityNone     Prenatal labs: ABO, Rh: --/--/PENDING (09/04 0756) O-Positive  Antibody: PENDING (09/04 0756)Negative  Rubella: Immune (04/15 0000) RPR:   Non-reactive HBsAg:   Non-reactive  HIV: Non-reactive (04/15 0000)  GBS: Negative/-- (08/27 1015)  Genetic screening  Negative quad and CF Anatomy US Female, normal anatomy with IUGR, limited views of ductal arch   Prenatal Transfer Tool  Maternal Diabetes: No Genetic Screening: Normal Maternal Ultrasounds/Referrals: IUGR Fetal Ultrasounds or other Referrals:  Referred to Materal Fetal Medicine  Maternal Substance Abuse:  Yes:  Type: Smoker, Marijuana Significant Maternal Medications:  None Significant Maternal Lab Results: Group B Strep negative  Results for orders placed or performed during the hospital encounter of 08/05/20 (from the past 24 hour(s))  CBC   Collection Time: 08/05/20  7:56 AM  Result Value Ref Range   WBC 9.9 4.0 - 10.5 K/uL   RBC 3.80 (L) 3.87 - 5.11 MIL/uL   Hemoglobin 12.2 12.0 - 15.0 g/dL   HCT 42.5 36 - 46 %   MCV 94.7 80.0 - 100.0 fL   MCH 32.1 26.0 - 34.0 pg   MCHC 33.9 30.0 - 36.0 g/dL   RDW 95.6 38.7 - 56.4 %   Platelets 213 150 - 400 K/uL   nRBC 0.0 0.0 - 0.2 %  Type and screen   Collection Time:  08/05/20  7:56 AM  Result Value Ref Range   ABO/RH(D) PENDING    Antibody Screen PENDING    Sample Expiration      08/08/2020,2359 Performed at [facility], [address], [place], [place] [address]     Patient Active Problem List   Diagnosis Date Noted   Encounter for induction of labor 08/05/2020   Supervision of high risk pregnancy, antepartum 07/28/2020   Tobacco smoking affecting pregnancy in third trimester 07/28/2020   IUGR (intrauterine growth restriction) affecting care of mother 07/28/2020   History of herpes genitalis 07/28/2020    Assessment/Plan:  Erica Todd is a 24 y.o. G4P1021 at 101w0d here for IOL due to IUGR. Pregnancy complicated by the above, tobacco and THC use, and history of HSV not on valtrex.   #Labor: Vaginal cytotec x1, serial cervical exams, may be a technically difficult exam to place a FB but may consider on next check  #Pain: Epidural  #FWB: Cat 1 #ID: GBS negative  #MOF: Formula  #MOC: Undecided, discussed options  #Circ: Yes, desires inpatient   #IUGR: 9% EFW on 8/25 U/S, 2253g. Normal quad screening, declined further evaluation. Previous child with IUGR as well. Will monitor closely.   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 Pelvic exam on admit without any evidence of lesions within vulva or vaginal area.   #THC and tobacco use:  Will re-clarify tobacco use with patient, however did endorse frequent THC use.  --Encouraged cessation --SW consult pp   Allayne Stack, DO  08/05/2020, 8:41 AM Attestation of Supervision of Student:  I confirm that I have verified the information documented in the  resident's  student's note and that I have also personally reperformed the history, physical exam and all medical decision making activities.  I have verified that all services and findings are accurately documented in this student's note; and I agree with management and plan as outlined in the documentation. I  have also made any necessary editorial changes.  Marylene Land, CNM [facility] [employer], [facility] 08/05/2020 11:41 AM

## 2020-08-05 NOTE — Progress Notes (Signed)
LABOR PROGRESS NOTE  Erica Todd is a 24 y.o. G4P1021 at [redacted]w[redacted]d admitted for IOL for IUGR.   Subjective: Sleeping, comfortable   Objective: BP 115/65   Pulse 63   Temp 98.4 F (36.9 C) (Oral)   Resp 16   LMP 11/28/2019 (Approximate)  or  Vitals:   08/05/20 0821 08/05/20 1032 08/05/20 1240 08/05/20 1244  BP:  122/67  115/65  Pulse:  76  63  Resp: 16  16   Temp: 98 F (36.7 C)  98.4 F (36.9 C)   TempSrc: Oral  Oral     Dilation: 1.5 Effacement (%): 20 Cervical Position: Posterior Station: 0 Presentation: Vertex Exam by:: lee FHT: baseline rate 140, moderate varibility, +acel, -decel Toco: Irregular   Labs: Lab Results  Component Value Date   WBC 9.9 08/05/2020   HGB 12.2 08/05/2020   HCT 36.0 08/05/2020   MCV 94.7 08/05/2020   PLT 213 08/05/2020    Patient Active Problem List   Diagnosis Date Noted  . Encounter for induction of labor 08/05/2020  . Supervision of high risk pregnancy, antepartum 07/28/2020  . Tobacco smoking affecting pregnancy in third trimester 07/28/2020  . IUGR (intrauterine growth restriction) affecting care of mother 07/28/2020  . History of herpes genitalis 07/28/2020    Assessment / Plan: 24 y.o. G4P1021 at [redacted]w[redacted]d here for IOL for IUGR.   Labor: S/p cytotec x2, last placed at 1340. Will check around 1730 and attempt FB at that time.  Fetal Wellbeing:  Cat 1  Pain Control:  Desires epidural  Anticipated MOD:  SVD    Leticia Penna, DO Family Medicine PGY-3  08/05/2020, 4:20 PM

## 2020-08-05 NOTE — Progress Notes (Signed)
LABOR PROGRESS NOTE  Erica Todd is a 24 y.o. G4P1021 at [redacted]w[redacted]d  admitted for IOL due to IUGR.   Subjective: Doing well, no concerns.   Objective: BP 122/66   Pulse 68   Temp 98.4 F (36.9 C) (Oral)   Resp 16   LMP 11/28/2019 (Approximate)  or  Vitals:   08/05/20 1032 08/05/20 1240 08/05/20 1244 08/05/20 1432  BP: 122/67  115/65 122/66  Pulse: 76  63 68  Resp:  16    Temp:  98.4 F (36.9 C)    TempSrc:  Oral      Dilation: 3 Effacement (%): 50 Cervical Position: Posterior Station: -2 Presentation: Vertex Exam by:: lee FHT: baseline rate 130, moderate varibility, +acel, -decel Toco: infrequent   Labs: Lab Results  Component Value Date   WBC 9.9 08/05/2020   HGB 12.2 08/05/2020   HCT 36.0 08/05/2020   MCV 94.7 08/05/2020   PLT 213 08/05/2020    Patient Active Problem List   Diagnosis Date Noted  . Encounter for induction of labor 08/05/2020  . Supervision of high risk pregnancy, antepartum 07/28/2020  . Tobacco smoking affecting pregnancy in third trimester 07/28/2020  . IUGR (intrauterine growth restriction) affecting care of mother 07/28/2020  . History of herpes genitalis 07/28/2020    Assessment / Plan: 24 y.o. G4P1021 at [redacted]w[redacted]d here for IOL for IUGR.   Labor: Cervical quite posterior and borderline thick--will proceed with one additional cytotec and transition to pit on next check.  Fetal Wellbeing:  Pain Control:  Epidural  Anticipated MOD:  SVD   Leticia Penna, DO  Family Medicine PGY-3 08/05/2020, 6:00 PM

## 2020-08-06 ENCOUNTER — Inpatient Hospital Stay (HOSPITAL_COMMUNITY): Payer: Medicaid Other | Admitting: Anesthesiology

## 2020-08-06 ENCOUNTER — Encounter (HOSPITAL_COMMUNITY): Payer: Self-pay | Admitting: Obstetrics and Gynecology

## 2020-08-06 DIAGNOSIS — Z3A37 37 weeks gestation of pregnancy: Secondary | ICD-10-CM | POA: Diagnosis not present

## 2020-08-06 DIAGNOSIS — O365930 Maternal care for other known or suspected poor fetal growth, third trimester, not applicable or unspecified: Secondary | ICD-10-CM | POA: Diagnosis not present

## 2020-08-06 MED ORDER — BENZOCAINE-MENTHOL 20-0.5 % EX AERO: 1.0000 "application " | INHALATION_SPRAY | CUTANEOUS | Status: DC | PRN

## 2020-08-06 MED ORDER — DIPHENHYDRAMINE HCL 25 MG PO CAPS: 25.0000 mg | ORAL_CAPSULE | Freq: Four times a day (QID) | ORAL | Status: DC | PRN

## 2020-08-06 MED ORDER — ZOLPIDEM TARTRATE 5 MG PO TABS: 5.0000 mg | ORAL_TABLET | Freq: Every evening | ORAL | Status: DC | PRN

## 2020-08-06 MED ORDER — SIMETHICONE 80 MG PO CHEW: 80.0000 mg | CHEWABLE_TABLET | ORAL | Status: DC | PRN

## 2020-08-06 MED ORDER — COCONUT OIL OIL: 1.0000 "application " | TOPICAL_OIL | Status: DC | PRN

## 2020-08-06 MED ORDER — SENNOSIDES-DOCUSATE SODIUM 8.6-50 MG PO TABS
2.0000 | ORAL_TABLET | ORAL | Status: DC
  Administered 2020-08-06 – 2020-08-07 (×2): 2 via ORAL
  Filled 2020-08-06 (×2): qty 2

## 2020-08-06 MED ORDER — EPHEDRINE 5 MG/ML INJ: 10.0000 mg | INTRAVENOUS | Status: DC | PRN

## 2020-08-06 MED ORDER — FENTANYL CITRATE (PF) 2500 MCG/50ML IJ SOLN
INTRAMUSCULAR | Status: DC | PRN
Start: 2020-08-06 — End: 2020-08-06
  Administered 2020-08-06: 12 mL/h via EPIDURAL

## 2020-08-06 MED ORDER — DIBUCAINE (PERIANAL) 1 % EX OINT: 1.0000 "application " | TOPICAL_OINTMENT | CUTANEOUS | Status: DC | PRN

## 2020-08-06 MED ORDER — ONDANSETRON HCL 4 MG PO TABS: 4.0000 mg | ORAL_TABLET | ORAL | Status: DC | PRN

## 2020-08-06 MED ORDER — LACTATED RINGERS IV SOLN: 500.0000 mL | Freq: Once | INTRAVENOUS | Status: DC

## 2020-08-06 MED ORDER — PHENYLEPHRINE 40 MCG/ML (10ML) SYRINGE FOR IV PUSH (FOR BLOOD PRESSURE SUPPORT): 80.0000 ug | PREFILLED_SYRINGE | INTRAVENOUS | Status: DC | PRN

## 2020-08-06 MED ORDER — PHENYLEPHRINE 40 MCG/ML (10ML) SYRINGE FOR IV PUSH (FOR BLOOD PRESSURE SUPPORT)
80.0000 ug | PREFILLED_SYRINGE | INTRAVENOUS | Status: DC | PRN
  Filled 2020-08-06: qty 10

## 2020-08-06 MED ORDER — ACETAMINOPHEN 325 MG PO TABS
650.0000 mg | ORAL_TABLET | ORAL | Status: DC | PRN
  Administered 2020-08-06 (×2): 650 mg via ORAL
  Filled 2020-08-06 (×2): qty 2

## 2020-08-06 MED ORDER — LIDOCAINE HCL (PF) 1 % IJ SOLN
INTRAMUSCULAR | Status: DC | PRN
  Administered 2020-08-06 (×2): 4 mL via EPIDURAL

## 2020-08-06 MED ORDER — ONDANSETRON HCL 4 MG/2ML IJ SOLN: 4.0000 mg | INTRAMUSCULAR | Status: DC | PRN

## 2020-08-06 MED ORDER — WITCH HAZEL-GLYCERIN EX PADS: 1.0000 "application " | MEDICATED_PAD | CUTANEOUS | Status: DC | PRN

## 2020-08-06 MED ORDER — TETANUS-DIPHTH-ACELL PERTUSSIS 5-2.5-18.5 LF-MCG/0.5 IM SUSP: 0.5000 mL | Freq: Once | INTRAMUSCULAR | Status: DC

## 2020-08-06 MED ORDER — IBUPROFEN 600 MG PO TABS
600.0000 mg | ORAL_TABLET | Freq: Four times a day (QID) | ORAL | Status: DC
  Administered 2020-08-06 – 2020-08-08 (×10): 600 mg via ORAL
  Filled 2020-08-06 (×11): qty 1

## 2020-08-06 MED ORDER — PRENATAL MULTIVITAMIN CH
1.0000 | ORAL_TABLET | Freq: Every day | ORAL | Status: DC
  Administered 2020-08-06 – 2020-08-08 (×3): 1 via ORAL
  Filled 2020-08-06 (×3): qty 1

## 2020-08-06 MED ORDER — DIPHENHYDRAMINE HCL 50 MG/ML IJ SOLN: 12.5000 mg | INTRAMUSCULAR | Status: DC | PRN

## 2020-08-06 MED ORDER — FENTANYL-BUPIVACAINE-NACL 0.5-0.125-0.9 MG/250ML-% EP SOLN
12.0000 mL/h | EPIDURAL | Status: DC | PRN
  Filled 2020-08-06: qty 250

## 2020-08-06 NOTE — Anesthesia Procedure Notes (Signed)
Epidural Patient location during procedure: OB Start time: 08/06/2020 12:53 AM End time: 08/06/2020 1:10 AM  Staffing Anesthesiologist: Lewie Loron, MD Performed: anesthesiologist   Preanesthetic Checklist Completed: patient identified, IV checked, risks and benefits discussed, monitors and equipment checked, pre-op evaluation and timeout performed  Epidural Patient position: sitting Prep: DuraPrep and site prepped and draped Patient monitoring: heart rate, continuous pulse ox and blood pressure Approach: midline Location: L3-L4 Injection technique: LOR air and LOR saline  Needle:  Needle type: Tuohy  Needle gauge: 17 G Needle length: 9 cm Needle insertion depth: 5 cm Catheter type: closed end flexible Catheter size: 19 Gauge Catheter at skin depth: 10 cm Test dose: negative  Assessment Sensory level: T8 Events: blood not aspirated, injection not painful, no injection resistance, no paresthesia and negative IV test  Additional Notes Reason for block:procedure for pain

## 2020-08-06 NOTE — Anesthesia Postprocedure Evaluation (Signed)
Anesthesia Post Note  Patient: Erica Todd  Procedure(s) Performed: AN AD HOC LABOR EPIDURAL     Patient location during evaluation: Mother Baby Anesthesia Type: Epidural Level of consciousness: awake Pain management: satisfactory to patient Vital Signs Assessment: post-procedure vital signs reviewed and stable Respiratory status: spontaneous breathing Cardiovascular status: stable Anesthetic complications: no   No complications documented.  Last Vitals:  Vitals:   08/06/20 0435 08/06/20 0537  BP: 113/65 (!) 117/57  Pulse: (!) 56 72  Resp: 16 16  Temp: (!) 36.4 C 36.7 C  SpO2: 100% 99%    Last Pain:  Vitals:   08/06/20 0537  TempSrc:   PainSc: 0-No pain   Pain Goal: Patients Stated Pain Goal: 4 (08/05/20 2203)                 Cephus Shelling

## 2020-08-06 NOTE — Discharge Summary (Addendum)
   Postpartum Discharge Summary    Patient Name: Erica Todd DOB: 07/19/1996 MRN: [id number]  Date of admission: 08/05/2020 Delivery date:08/06/2020  Delivering provider: LEFTWICH-KIRBY, LISA A  Date of discharge: 08/08/2020  Admitting diagnosis: Encounter for induction of labor [Z34.90] Intrauterine pregnancy: [redacted]w[redacted]d     Secondary diagnosis:  Active Problems:   Encounter for induction of labor  Additional problems: IUGR, THC use during pregnancy     Discharge diagnosis: Term Pregnancy Delivered                                              Post partum procedures: None Augmentation: Pitocin and Cytotec Complications: None  Hospital course: Induction of Labor With Vaginal Delivery   24 y.o. yo G4P1021 at [redacted]w[redacted]d was admitted to the hospital 08/05/2020 for induction of labor.  Indication for induction:  IUGR .  Patient had an uncomplicated labor course as follows: Membrane Rupture Time/Date: 1:26 AM ,08/06/2020   Delivery Method:Vaginal, Spontaneous  Episiotomy: None  Lacerations:  None  Details of delivery can be found in separate delivery note.  Patient had a routine postpartum course. SW evaluated and identified no barriers to discharge. Patient is discharged home 08/08/20.  Newborn Data: Birth date:08/06/2020  Birth time:2:22 AM  Gender:Female  Living status:Living  Apgars:9 ,9  Weight:2225 g   Magnesium Sulfate received: No BMZ received: No Rhophylac:No MMR:No T-DaP:Given prenatally Flu: No Transfusion:No  Physical exam  Vitals:   08/07/20 1430 08/07/20 1530 08/07/20 2025 08/08/20 0505  BP: 106/63  118/73 104/70  Pulse: (!) 46 60 (!) 58 60  Resp: 16  18 18  Temp:   98.5 F (36.9 C) 98.3 F (36.8 C)  TempSrc:   Oral Oral  SpO2: 98%  98% 100%  Weight:      Height:       General: alert, cooperative and no distress Lochia: appropriate Uterine Fundus: firm Incision: N/A DVT Evaluation: No evidence of DVT seen on physical exam. No significant calf/ankle  edema. Labs: Lab Results  Component Value Date   WBC 9.9 08/05/2020   HGB 12.2 08/05/2020   HCT 36.0 08/05/2020   MCV 94.7 08/05/2020   PLT 213 08/05/2020   CMP Latest Ref Rng & Units 05/23/2017  Glucose 65 - 99 mg/dL 96  BUN 6 - 20 mg/dL 10  Creatinine 0.44 - 1.00 mg/dL 0.80  Sodium 135 - 145 mmol/L 139  Potassium 3.5 - 5.1 mmol/L 3.8  Chloride 101 - 111 mmol/L 103  CO2 19 - 32 mEq/L -  Calcium 8.4 - 10.5 mg/dL -  Total Protein 6.0 - 8.3 g/dL -  Total Bilirubin 0.3 - 1.2 mg/dL -  Alkaline Phos 47 - 119 U/L -  AST 0 - 37 U/L -  ALT 0 - 35 U/L -   Edinburgh Score: Edinburgh Postnatal Depression Scale Screening Tool 08/06/2020  I have been able to laugh and see the funny side of things. 0  I have looked forward with enjoyment to things. 1  I have blamed myself unnecessarily when things went wrong. 1  I have been anxious or worried for no good reason. 0  I have felt scared or panicky for no good reason. 1  Things have been getting on top of me. 2  I have been so unhappy that I have had difficulty sleeping. 2  I have felt sad or miserable.   0  I have been so unhappy that I have been crying. 0  The thought of harming myself has occurred to me. 0  Edinburgh Postnatal Depression Scale Total 7     After visit meds:  Allergies as of 08/08/2020   No Known Allergies      Medication List     STOP taking these medications    valACYclovir 500 MG tablet Commonly known as: VALTREX       TAKE these medications    acetaminophen 325 MG tablet Commonly known as: Tylenol Take 2 tablets (650 mg total) by mouth every 4 (four) hours as needed (for pain scale < 4).   ibuprofen 600 MG tablet Commonly known as: ADVIL Take 1 tablet (600 mg total) by mouth every 6 (six) hours as needed.         Discharge home in stable condition Infant Feeding: Bottle Infant Disposition:rooming in Discharge instruction: per After Visit Summary and Postpartum booklet. Activity: Advance as  tolerated. Pelvic rest for 6 weeks.  Diet: routine diet Future Appointments: Future Appointments  Date Time Provider Department Center  09/06/2020  3:15 PM Dawson, Rolitta, CNM [facility]   Follow up Visit:   PP message sent to [facility] on 08/06/20:  Please schedule this patient for a Virtual postpartum visit in 4 weeks with the following provider: Any provider. Additional Postpartum F/U: n/a   High risk pregnancy complicated by:  IUGR Delivery mode:  Vaginal, Spontaneous  Anticipated Birth Control:  Unsure but aware of options.    08/08/2020 Samantha N Beard, DO   Attestation of Supervision of Student:  I confirm that I have verified the information documented in the  resident  student's note and that I have also personally reperformed the history, physical exam and all medical decision making activities.  I have verified that all services and findings are accurately documented in this student's note; and I agree with management and plan as outlined in the documentation. I have also made any necessary editorial changes.    Kathryn Lorraine Kooistra, CNM [facility], [facility] 08/08/2020 11:17 AM  

## 2020-08-06 NOTE — Plan of Care (Signed)
  Problem: Activity: Goal: Ability to tolerate increased activity will improve Outcome: Completed/Met Note: Discussed the importance of emptying bladder frequently to prevent increased bleeding and pain. Maxwell Caul, Leretha Dykes [place]

## 2020-08-06 NOTE — Anesthesia Preprocedure Evaluation (Signed)
Anesthesia Evaluation  Patient identified by MRN, date of birth, ID band Patient awake    Reviewed: Allergy & Precautions, H&P , Patient's Chart, lab work & pertinent test results  Airway Mallampati: II  TM Distance: >3 FB Neck ROM: full    Dental  (+) Teeth Intact   Pulmonary Current Smoker,    breath sounds clear to auscultation       Cardiovascular negative cardio ROS   Rhythm:regular Rate:Normal     Neuro/Psych  Headaches, negative psych ROS   GI/Hepatic negative GI ROS, Neg liver ROS,   Endo/Other  negative endocrine ROS  Renal/GU negative Renal ROS     Musculoskeletal negative musculoskeletal ROS (+)   Abdominal   Peds  Hematology negative hematology ROS (+)   Anesthesia Other Findings       Reproductive/Obstetrics (+) Pregnancy                             Anesthesia Physical  Anesthesia Plan  ASA: II  Anesthesia Plan: Epidural   Post-op Pain Management:    Induction:   PONV Risk Score and Plan:   Airway Management Planned:   Additional Equipment:   Intra-op Plan:   Post-operative Plan:   Informed Consent: I have reviewed the patients History and Physical, chart, labs and discussed the procedure including the risks, benefits and alternatives for the proposed anesthesia with the patient or authorized representative who has indicated his/her understanding and acceptance.       Plan Discussed with:   Anesthesia Plan Comments:         Anesthesia Quick Evaluation

## 2020-08-07 DIAGNOSIS — Z3A37 37 weeks gestation of pregnancy: Secondary | ICD-10-CM

## 2020-08-07 NOTE — Progress Notes (Signed)
POSTPARTUM PROGRESS NOTE  Subjective: Erica Todd is a 24 y.o. T2P4982 on PPD#1 s/p NSVD at [redacted]w[redacted]d.  She reports she doing well. No acute events overnight. She denies any problems with ambulating, voiding or po intake. Denies nausea or vomiting. She has passed flatus. Pain is well controlled.  Lochia is appropriate.  Objective: Blood pressure 116/67, pulse (!) 51, temperature 98.5 F (36.9 C), temperature source Oral, resp. rate 16, height 5\' 5"  (1.651 m), weight 70.8 kg, last menstrual period 11/28/2019, SpO2 100 %, unknown if currently breastfeeding.  Physical Exam:  General: alert, cooperative and no distress Chest: no respiratory distress Abdomen: soft, non-tender  Uterine Fundus: firm, appropriately tender Extremities: No calf swelling or tenderness  No edema  Recent Labs    08/05/20 0756  HGB 12.2  HCT 36.0    Assessment/Plan: Erica Todd is a 24 y.o. 21 on PPD#1 s/p NSVD at [redacted]w[redacted]d  Routine Postpartum Care: Doing well, pain well-controlled.  -- Continue routine care, lactation support  -- Contraception: unsure, would like to discuss at [facility] visit -- Feeding: breast/bottle  Dispo: Plan for discharge tomorrow.  [facility], DO OB/GYN Fellow, [facility] for [facility]

## 2020-08-07 NOTE — Clinical Social Work Maternal (Signed)
CLINICAL SOCIAL WORK MATERNAL/CHILD NOTE  Patient Details  Name: Erica Todd MRN: [id number] Date of Birth: 01/26/1996  Date:  27-Dec-2019  Clinical Social Worker Initiating Note:  Hortencia Pilar, LCSW Date/Time: Initiated:  08/07/20/0930     Child's Name:  Erica Todd   Biological Parents:  Mother, Father Erica Todd, Dagmar Hait)   Need for Interpreter:  None   Reason for Referral:  Current Substance Use/Substance Use During Pregnancy    Address:  [address] [place] [address]    Phone number:  [phone] (home)     Additional phone number: none   Household Members/Support Persons (HM/SP):   Household Member/Support Person 1   HM/SP Name Relationship DOB or Age  HM/SP -1  Erica Todd  [facility] 05/11/1996  HM/SP -2 Theron Rosman FOB  03/14/1955  HM/SP -3 Ja'Cion Farrow  son 06/26/2015  HM/SP -4        HM/SP -5        HM/SP -6        HM/SP -7        HM/SP -8          Natural Supports (not living in the home):  Other (Comment) (grandma)   Professional Supports: None   Employment: Full-time   Type of Work: Scientist, research (medical)   Education:  9 to 11 years   Homebound arranged: No  Financial Resources:  [employer]   Other Resources:  Sales executive , [facility]   Cultural/Religious Considerations Which May Impact Care:  none reported.   Strengths:  Ability to meet basic needs , Compliance with medical plan , Home prepared for child , Pediatrician chosen   Psychotropic Medications:       None   Pediatrician:    [place]  Pediatrician List:   [facility] ([address] [employer])  [place]    [place]      Pediatrician Fax Number:    Risk Factors/Current Problems:  Substance Use , None   Cognitive State:  Insightful , Able to Concentrate , Alert    Mood/Affect:  Relaxed , Comfortable , Calm , Interested    CSW Assessment: CSW consulted  for [facility] use in pregnancy as well as LPNC. CSW went to speak with MOB at bedside to address further needs.   CSW congratulated MOB on the birth of infant. CSW advised MOB of CSW's role and the reason for CSW coming to speak with her. 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 MOB disclosed that she used twice a week with her last use being "about a week ago". MOB reported that she would "take a hit of brother joint". CSW understanding and advised MOB of the hospital drug screen policy. MOB was advised that infants UDS was positive therefore CSW would make CPS report. MOB reported that she has previous CPS hx due to oldest son being born with THC in UDS as well . MOB expressed that this case was closed in 2016 and reported no other hx. CSW advised MOB that if infants CDS returns positive for any other substances then CSW would need to update CPS report, MOB reported that she understood.   MOB reported that her support is her grandmother as well as "Kleiner". CSW was advised that MOB gets [facility] and Sales executive.   CSW inquired  from MOB on reason for LPNC MOB reported "I initially didn't want him. I was angry and crying because I didn't want the baby. I would go to my ultrasounds but that was it". CSW inquired from MOB on reason for not wanting infant. MOB expressed that "he's old (FOB". CSW unable to obtain clear explanation however asked MOB if it was due to financial  issues or anything? MOB reported no. CSW asked MOB if she was forced to have infant and MOB paused for a few seconds to think and the reported "no". CSW offered MOB further resources to assist with infant in which MOB declined. CSW took time to ask MOB about mental health. MOB originally  reported no hx and then as CSW began to explain PPD MOB reported that she thinks that she did have feelings of PPD. MOB expressed that she felt sad a lot and just didn't want to do anything. MOB  also reported that with this pregnancy she didn't want to do anything.  CSW took time to provide MOB with PPD and SIDS education.MOB was given PPD Checklist in order for MOB to keep track of her feelings as they relate to PPD. MOB thanked CSW and reported no other needs.   CSW will continue to monitor infant CDS and update CPS report if warranted. NO barriers to d/c.     CSW Plan/Description:  No Further Intervention Required/No Barriers to Discharge, Perinatal Mood and Anxiety Disorder (PMADs) Education, Sudden Infant Death Syndrome (SIDS) Education, Hospital Drug Screen Policy Information, Child Protective Service Report , CSW Will Continue to Monitor Umbilical Cord Tissue Drug Screen Results and Make Report if Warranted    Mossie Gilder S Braylinn Gulden, LCSWA 08/07/2020, 9:51 AM 

## 2020-08-08 MED ORDER — ACETAMINOPHEN 325 MG PO TABS: 650.0000 mg | ORAL_TABLET | ORAL | Status: DC | PRN

## 2020-08-08 MED ORDER — IBUPROFEN 600 MG PO TABS
600.0000 mg | ORAL_TABLET | Freq: Four times a day (QID) | ORAL | 0 refills | Status: DC | PRN
Start: 2020-08-08 — End: 2022-01-10

## 2020-08-08 NOTE — Discharge Instructions (Signed)

## 2020-09-06 ENCOUNTER — Encounter: Payer: Self-pay | Admitting: Obstetrics and Gynecology

## 2020-09-06 ENCOUNTER — Other Ambulatory Visit: Payer: Self-pay

## 2020-09-06 ENCOUNTER — Ambulatory Visit (INDEPENDENT_AMBULATORY_CARE_PROVIDER_SITE_OTHER): Payer: Medicaid Other | Admitting: Obstetrics and Gynecology

## 2020-09-06 DIAGNOSIS — O165 Unspecified maternal hypertension, complicating the puerperium: Secondary | ICD-10-CM

## 2020-09-06 LAB — POCT URINALYSIS DIP (DEVICE)
Bilirubin Urine: NEGATIVE
Glucose, UA: NEGATIVE mg/dL
Hgb urine dipstick: NEGATIVE
Ketones, ur: NEGATIVE mg/dL
Leukocytes,Ua: NEGATIVE
Nitrite: NEGATIVE
Protein, ur: NEGATIVE mg/dL
Specific Gravity, Urine: 1.02 (ref 1.005–1.030)
Urobilinogen, UA: 0.2 mg/dL (ref 0.0–1.0)
pH: 6.5 (ref 5.0–8.0)

## 2020-09-06 MED ORDER — NIFEDIPINE ER OSMOTIC RELEASE 30 MG PO TB24: 30.0000 mg | ORAL_TABLET | Freq: Every day | ORAL | 0 refills | Status: DC

## 2020-09-06 NOTE — Patient Instructions (Signed)
Postpartum Hypertension Postpartum hypertension is high blood pressure that remains higher than normal after childbirth. You may not realize that you have postpartum hypertension if your blood pressure is not being checked regularly. In most cases, postpartum hypertension will go away on its own, usually within a week of delivery. However, for some women, medical treatment is required to prevent serious complications, such as seizures or stroke. What are the causes? This condition may be caused by one or more of the following:  Hypertension that existed before pregnancy (chronic hypertension).  Hypertension that comes on as a result of pregnancy (gestational hypertension).  Hypertensive disorders during pregnancy (preeclampsia) or seizures in women who have high blood pressure during pregnancy (eclampsia).  A condition in which the liver, platelets, and red blood cells are damaged during pregnancy (HELLP syndrome).  A condition in which the thyroid produces too much hormones (hyperthyroidism).  Other rare problems of the nerves (neurological disorders) or blood disorders. In some cases, the cause may not be known. What increases the risk? The following factors may make you more likely to develop this condition:  Chronic hypertension. In some cases, this may not have been diagnosed before pregnancy.  Obesity.  Type 2 diabetes.  Kidney disease.  History of preeclampsia or eclampsia.  Other medical conditions that change the level of hormones in the body (hormonal imbalance). What are the signs or symptoms? As with all types of hypertension, postpartum hypertension may not have any symptoms. Depending on how high your blood pressure is, you may experience:  Headaches. These may be mild, moderate, or severe. They may also be steady, constant, or sudden in onset (thunderclap headache).  Changes in your ability to see (visual changes).  Dizziness.  Shortness of breath.  Swelling  of your hands, feet, lower legs, or face. In some cases, you may have swelling in more than one of these locations.  Heart palpitations or a racing heartbeat.  Difficulty breathing while lying down.  Decrease in the amount of urine that you pass. Other rare signs and symptoms may include:  Sweating more than usual. This lasts longer than a few days after delivery.  Chest pain.  Sudden dizziness when you get up from sitting or lying down.  Seizures.  Nausea or vomiting.  Abdominal pain. How is this diagnosed? This condition may be diagnosed based on the results of a physical exam, blood pressure measurements, and blood and urine tests. You may also have other tests, such as a CT scan or an MRI, to check for other problems of postpartum hypertension. How is this treated? If blood pressure is high enough to require treatment, your options may include:  Medicines to reduce blood pressure (antihypertensives). Tell your health care provider if you are breastfeeding or if you plan to breastfeed. There are many antihypertensive medicines that are safe to take while breastfeeding.  Stopping medicines that may be causing hypertension.  Treating medical conditions that are causing hypertension.  Treating the complications of hypertension, such as seizures, stroke, or kidney problems. Your health care provider will also continue to monitor your blood pressure closely until it is within a safe range for you. Follow these instructions at home:  Take over-the-counter and prescription medicines only as told by your health care provider.  Return to your normal activities as told by your health care provider. Ask your health care provider what activities are safe for you.  Do not use any products that contain nicotine or tobacco, such as cigarettes and e-cigarettes. If   you need help quitting, ask your health care provider.  Keep all follow-up visits as told by your health care provider. This  is important. Contact a health care provider if:  Your symptoms get worse.  You have new symptoms, such as: ? A headache that does not get better. ? Dizziness. ? Visual changes. Get help right away if:  You suddenly develop swelling in your hands, ankles, or face.  You have sudden, rapid weight gain.  You develop difficulty breathing, chest pain, racing heartbeat, or heart palpitations.  You develop severe pain in your abdomen.  You have any symptoms of a stroke. "BE FAST" is an easy way to remember the main warning signs of a stroke: ? B - Balance. Signs are dizziness, sudden trouble walking, or loss of balance. ? E - Eyes. Signs are trouble seeing or a sudden change in vision. ? F - Face. Signs are sudden weakness or numbness of the face, or the face or eyelid drooping on one side. ? A - Arms. Signs are weakness or numbness in an arm. This happens suddenly and usually on one side of the body. ? S - Speech. Signs are sudden trouble speaking, slurred speech, or trouble understanding what people say. ? T - Time. Time to call emergency services. Write down what time symptoms started.  You have other signs of a stroke, such as: ? A sudden, severe headache with no known cause. ? Nausea or vomiting. ? Seizure. These symptoms may represent a serious problem that is an emergency. Do not wait to see if the symptoms will go away. Get medical help right away. Call your local emergency services (911 in the [place] Do not drive yourself to the hospital. Summary  Postpartum hypertension is high blood pressure that remains higher than normal after childbirth.  In most cases, postpartum hypertension will go away on its own, usually within a week of delivery.  For some women, medical treatment is required to prevent serious complications, such as seizures or stroke. This information is not intended to replace advice given to you by your health care provider. Make sure you discuss any questions  you have with your health care provider. Document Revised: 12/25/2018 Document Reviewed: 09/08/2017 Elsevier Patient Education  2020 Elsevier Inc.  

## 2020-09-06 NOTE — Progress Notes (Signed)
Post Partum Visit Note  Erica Todd is a 24 y.o. [phone] female who presents for a postpartum visit. She is 4 weeks postpartum following a SVD.  I have fully reviewed the prenatal and intrapartum course. The delivery was at 37.1 gestational weeks.  Anesthesia: epidural. Postpartum course has been unremarkable. Baby is doing well. Baby is feeding by bottle - gerber . Bleeding no bleeding. Bowel function is normal. Bladder function is normal. Patient is not sexually active. Contraception method is none. Postpartum depression screening: Negative.   The pregnancy intention screening data noted above was reviewed. Potential methods of contraception were discussed. The patient elected to proceed with No Method - Other Reason.    Edinburgh Postnatal Depression Scale - 09/06/20 1533      Edinburgh Postnatal Depression Scale:  In the Past 7 Days   I have been able to laugh and see the funny side of things. 0    I have looked forward with enjoyment to things. 0    I have blamed myself unnecessarily when things went wrong. 0    I have been anxious or worried for no good reason. 0    I have felt scared or panicky for no good reason. 0    Things have been getting on top of me. 0    I have been so unhappy that I have had difficulty sleeping. 0    I have felt sad or miserable. 0    I have been so unhappy that I have been crying. 0    The thought of harming myself has occurred to me. 0    Edinburgh Postnatal Depression Scale Total 0            The following portions of the patient's history were reviewed and updated as appropriate: allergies, current medications, past family history, past medical history, past social history, past surgical history and problem list.  Review of Systems Constitutional: negative Eyes: negative Ears, nose, mouth, throat, and face: negative Respiratory: negative Cardiovascular: negative Gastrointestinal: negative Genitourinary:negative Integument/breast:  negative Hematologic/lymphatic: negative Musculoskeletal:negative Neurological: negative Behavioral/Psych: negative Endocrine: negative Allergic/Immunologic: negative    Objective:  Blood pressure (!) 145/87, pulse (!) 50, height 5\' 5"  (1.651 m), weight 152 lb (68.9 kg), last menstrual period 11/28/2019, unknown if currently breastfeeding.  General:  alert, cooperative and no distress   Breasts:  inspection negative, no nipple discharge or bleeding, no masses or nodularity palpable  Lungs: clear to auscultation bilaterally  Heart:  regular rate and rhythm, S1, S2 normal, no murmur, click, rub or gallop  Abdomen: soft, non-tender; bowel sounds normal; no masses,  no organomegaly   Vulva:  not evaluated  Vagina: not evaluated  Cervix:  not evaluated  Corpus: not examined  Adnexa:  not evaluated  Rectal Exam: Not performed.        Assessment:  Encounter for postpartum visit  - Normal postpartum exam. Pap smear 03/16/2020.  Postpartum hypertension  - POCT urinalysis dip (device),  - Comprehensive metabolic panel,  - CBC,  - Protein / creatinine ratio, urine,  - Rx for NIFEdipine (PROCARDIA XL) 30 MG 24 hr tablet - BP check in 1 week   Plan:   Essential components of care per ACOG recommendations:  1.  Mood and well being: Patient with negative depression screening today. Reviewed local resources for support.  - Patient does not use tobacco.  - hx of drug use? No    2. Infant care and feeding:  -Patient currently breastmilk feeding?  No   3. Sexuality, contraception and birth spacing - Patient does not want a pregnancy in the next year.  Desired family size is unsure of number of children.  - Reviewed forms of contraception in tiered fashion. Patient desired no method  today.   - Discussed birth spacing of 18 months  4. Sleep and fatigue -Encouraged family/partner/community support of 4 hrs of uninterrupted sleep to help with mood and fatigue  5. Physical Recovery  -  Discussed patients delivery and complications - Patient had no laceration, perineal healing reviewed. Patient expressed understanding - Patient has urinary incontinence? No  - Patient is safe to resume physical and sexual activity  6.  Health Maintenance - Last pap smear done 03/16/2020 and was normal with negative HPV. No Mammogram  7. No Chronic Disease - PCP follow up  Raelyn Mora, CNM [facility] [employer], [facility]

## 2020-09-07 ENCOUNTER — Telehealth: Payer: Self-pay | Admitting: General Practice

## 2020-09-07 LAB — COMPREHENSIVE METABOLIC PANEL
ALT: 9 IU/L (ref 0–32)
AST: 13 IU/L (ref 0–40)
Albumin/Globulin Ratio: 1.7 (ref 1.2–2.2)
Albumin: 4.7 g/dL (ref 3.9–5.0)
Alkaline Phosphatase: 76 IU/L (ref 44–121)
BUN/Creatinine Ratio: 11 (ref 9–23)
BUN: 9 mg/dL (ref 6–20)
Bilirubin Total: 0.3 mg/dL (ref 0.0–1.2)
CO2: 23 mmol/L (ref 20–29)
Calcium: 9.5 mg/dL (ref 8.7–10.2)
Chloride: 102 mmol/L (ref 96–106)
Creatinine, Ser: 0.84 mg/dL (ref 0.57–1.00)
GFR calc Af Amer: 114 mL/min/{1.73_m2} (ref >59)
GFR calc non Af Amer: 99 mL/min/{1.73_m2} (ref >59)
Globulin, Total: 2.7 g/dL (ref 1.5–4.5)
Glucose: 71 mg/dL (ref 65–99)
Potassium: 3.9 mmol/L (ref 3.5–5.2)
Sodium: 139 mmol/L (ref 134–144)
Total Protein: 7.4 g/dL (ref 6.0–8.5)

## 2020-09-07 LAB — PROTEIN / CREATININE RATIO, URINE
Creatinine, Urine: 82.7 mg/dL
Protein, Ur: 7.3 mg/dL
Protein/Creat Ratio: 88 mg/g creat (ref 0–200)

## 2020-09-07 LAB — CBC
Hematocrit: 38.5 % (ref 34.0–46.6)
Hemoglobin: 13.5 g/dL (ref 11.1–15.9)
MCH: 31.8 pg (ref 26.6–33.0)
MCHC: 35.1 g/dL (ref 31.5–35.7)
MCV: 91 fL (ref 79–97)
Platelets: 249 10*3/uL (ref 150–450)
RBC: 4.24 x10E6/uL (ref 3.77–5.28)
RDW: 12 % (ref 11.7–15.4)
WBC: 7.2 10*3/uL (ref 3.4–10.8)

## 2020-09-07 NOTE — Telephone Encounter (Signed)
Patient called and left message on nurse voicemail line stating she talked to the after hours nurse and was given procardia yesterday for HTN by Rolitta. Patient states she woke up at 2am and had a massive headache and is afraid it was a reaction to the medication. Patient would like a call back. Called patient and she states she took the procardia around 6pm yesterday evening and woke up with a massive headache at 2am. Patient states she took tylenol and it took her headache away. Told patient it seemed unlikely to be coming from the procardia because of how long after she took it the headache occurred. Told patient to continue tylenol/motrin as needed and it call us if HA doesn't resolve. Also told patient to call us back if the same thing happens tonight and that we close early tomorrow. Patient verbalized understanding.

## 2020-09-13 ENCOUNTER — Encounter: Payer: Self-pay | Admitting: *Deleted

## 2020-09-13 ENCOUNTER — Other Ambulatory Visit: Payer: Self-pay

## 2020-09-13 ENCOUNTER — Ambulatory Visit (INDEPENDENT_AMBULATORY_CARE_PROVIDER_SITE_OTHER): Payer: Medicaid Other | Admitting: *Deleted

## 2020-09-13 DIAGNOSIS — O165 Unspecified maternal hypertension, complicating the puerperium: Secondary | ICD-10-CM

## 2020-09-13 MED ORDER — NIFEDIPINE ER OSMOTIC RELEASE 30 MG PO TB24: 30.0000 mg | ORAL_TABLET | Freq: Every day | ORAL | 0 refills | Status: DC

## 2020-09-13 NOTE — Progress Notes (Signed)
Patient was assessed and managed by nursing staff during this encounter. I have reviewed the chart and agree with the documentation and plan.   By chart review it appears that patient was started on medication based on visit vitals at 4 weeks PP. Patient is tolerating medication well and BP is normal today. Will plan to continue x 1 month and then recheck BP in office to determine if long term medication management is needed.  If continued BP management is needed, refer to PCP.   Vonzella Nipple, PA-C 09/13/2020 3:19 PM

## 2020-09-13 NOTE — Progress Notes (Signed)
Pt presents for BP check. She had vaginal delivery on 08/06/20 and was found to have elevated BP during PP exam on 10/6. Pt was started on Nifedipine. Today she reports having one H/A on same Emmerich Cryer Nifedipine was started, none since. She denies all visual disturbances. Per consult w/Julie Magnus Sinning, pt was advised to continue taking Nifedipine daily as prescribed. She should notify our office if she develops dizziness, blurry vision, seeing spots, weakness or  recurrent H/A. She does not have a PCP and will return in one month for BP check. Pt voiced understanding of all information and instructions given.

## 2020-10-11 ENCOUNTER — Other Ambulatory Visit: Payer: Self-pay

## 2020-10-11 ENCOUNTER — Ambulatory Visit (INDEPENDENT_AMBULATORY_CARE_PROVIDER_SITE_OTHER): Payer: Medicaid Other | Admitting: General Practice

## 2020-10-11 VITALS — BP 108/67 | HR 56 | Ht 65.0 in | Wt 148.0 lb

## 2020-10-11 DIAGNOSIS — Z0130 Encounter for examination of blood pressure without abnormal findings: Secondary | ICD-10-CM

## 2020-10-11 NOTE — Progress Notes (Signed)
Patient presents to office today for BP check. Patient delivered 9/5 and followed up for her postpartum visit on 10/6. Patient was found to have postpartum hypertension at that appointment and was started on procardia 30mg  daily. Patient followed up in office 1 week later and found to have a normal BP of 125/76. She was then instructed to continue medication and follow up in a month for repeat BP check. BP 108/67 today. Patient has no prior history of BP issues. Reviewed history/vitals with Dr who advises patient discontinue procardia and follow up with pcp in a month. Discussed with patient & provided information regarding local PCP practices. Patient verbalized understanding.  Donavan Foil RN BSN 10/11/20

## 2020-10-11 NOTE — Progress Notes (Signed)
Patient was assessed and managed by nursing staff during this encounter. I have reviewed the chart and agree with the documentation and plan. I have also made any necessary editorial changes.  Warden Fillers, MD 10/11/2020 10:06 AM

## 2020-11-10 ENCOUNTER — Other Ambulatory Visit: Payer: Self-pay | Admitting: Medical

## 2020-11-10 DIAGNOSIS — O165 Unspecified maternal hypertension, complicating the puerperium: Secondary | ICD-10-CM

## 2021-01-23 ENCOUNTER — Encounter: Payer: Self-pay | Admitting: Family Medicine

## 2021-01-23 ENCOUNTER — Other Ambulatory Visit: Payer: Self-pay

## 2021-01-23 ENCOUNTER — Ambulatory Visit: Payer: Medicaid Other | Attending: Family Medicine | Admitting: Family Medicine

## 2021-01-23 VITALS — BP 106/68 | HR 55 | Ht 65.0 in | Wt 149.0 lb

## 2021-01-23 DIAGNOSIS — Z8759 Personal history of other complications of pregnancy, childbirth and the puerperium: Secondary | ICD-10-CM

## 2021-01-23 NOTE — Patient Instructions (Signed)

## 2021-01-23 NOTE — Progress Notes (Signed)
Subjective:  Patient ID: Erica Todd, female    DOB: 1996-06-30  Age: 25 y.o. MRN: [id number]  CC: New Patient (Initial Visit)   HPI Ashrita Chrismer is a 25 year old female with a history of hypertension in pregnancy who presents today to establish care. She is currently not on antihypertensive but endorses compliance with checking her blood pressures and a low-sodium diet.  She does not exercise regularly. She has no additional concerns today.  Past Medical History:  Diagnosis Date  . QPRFFMBW(466.5)     Past Surgical History:  Procedure Laterality Date  . NO PAST SURGERIES      Family History  Problem Relation Age of Onset  . Alcohol abuse Neg Hx   . Arthritis Neg Hx   . Asthma Neg Hx   . Birth defects Neg Hx   . Cancer Neg Hx   . COPD Neg Hx   . Depression Neg Hx   . Diabetes Neg Hx   . Drug abuse Neg Hx   . Early death Neg Hx   . Hearing loss Neg Hx   . Heart disease Neg Hx   . Hyperlipidemia Neg Hx   . Hypertension Neg Hx   . Kidney disease Neg Hx   . Learning disabilities Neg Hx   . Mental illness Neg Hx   . Mental retardation Neg Hx   . Miscarriages / Stillbirths Neg Hx   . Stroke Neg Hx   . Vision loss Neg Hx   . Varicose Veins Neg Hx     No Known Allergies  Outpatient Medications Prior to Visit  Medication Sig Dispense Refill  . acetaminophen (TYLENOL) 325 MG tablet Take 2 tablets (650 mg total) by mouth every 4 (four) hours as needed (for pain scale < 4). (Patient not taking: No sig reported)    . ibuprofen (ADVIL) 600 MG tablet Take 1 tablet (600 mg total) by mouth every 6 (six) hours as needed. (Patient not taking: No sig reported) 30 tablet 0  . NIFEdipine (PROCARDIA XL) 30 MG 24 hr tablet Take 1 tablet (30 mg total) by mouth daily. (Patient not taking: Reported on 01/23/2021) 30 tablet 0   No facility-administered medications prior to visit.     ROS Review of Systems  Constitutional: Negative for activity change, appetite change and  fatigue.  HENT: Negative for congestion, sinus pressure and sore throat.   Eyes: Negative for visual disturbance.  Respiratory: Negative for cough, chest tightness, shortness of breath and wheezing.   Cardiovascular: Negative for chest pain and palpitations.  Gastrointestinal: Negative for abdominal distention, abdominal pain and constipation.  Endocrine: Negative for polydipsia.  Genitourinary: Negative for dysuria and frequency.  Musculoskeletal: Negative for arthralgias and back pain.  Skin: Negative for rash.  Neurological: Negative for tremors, light-headedness and numbness.  Hematological: Does not bruise/bleed easily.  Psychiatric/Behavioral: Negative for agitation and behavioral problems.    Objective:  BP 106/68   Pulse (!) 55   Ht 5\' 5"  (1.651 m)   Wt 149 lb (67.6 kg)   SpO2 100%   BMI 24.79 kg/m   BP/Weight 01/23/2021 10/11/2020 09/13/2020  Systolic BP 106 108 125  Diastolic BP 68 67 76  Wt. (Lbs) 149 148 146  BMI 24.79 24.63 24.3      Physical Exam Constitutional:      Appearance: She is well-developed.  Neck:     Vascular: No JVD.  Cardiovascular:     Rate and Rhythm: Bradycardia present.     Heart sounds:  Normal heart sounds. No murmur heard.   Pulmonary:     Effort: Pulmonary effort is normal.     Breath sounds: Normal breath sounds. No wheezing or rales.  Chest:     Chest wall: No tenderness.  Abdominal:     General: Bowel sounds are normal. There is no distension.     Palpations: Abdomen is soft. There is no mass.     Tenderness: There is no abdominal tenderness.  Musculoskeletal:        General: Normal range of motion.     Right lower leg: No edema.     Left lower leg: No edema.  Neurological:     Mental Status: She is alert and oriented to person, place, and time.  Psychiatric:        Mood and Affect: Mood normal.     CMP Latest Ref Rng & Units 09/06/2020 05/23/2017 02/25/2014  Glucose 65 - 99 mg/dL 71 96 93  BUN 6 - 20 mg/dL 9 10 7    Creatinine 0.57 - 1.00 mg/dL 6.23 7.62  Sodium 134 - 144 mmol/L 139 139 139  Potassium 3.5 - 5.2 mmol/L 3.9 3.8 4.0  Chloride 96 - 106 mmol/L 102 103 104  CO2 20 - 29 mmol/L 23 - 24  Calcium 8.7 - 10.2 mg/dL 9.5 - 9.5  Total Protein 6.0 - 8.5 g/dL 7.4 - 7.7  Total Bilirubin 0.0 - 1.2 mg/dL 0.3 - 0.4  Alkaline Phos 44 - 121 IU/L 76 - 63  AST 0 - 40 IU/L 13 - 19  ALT 0 - 32 IU/L 9 - 7    Lipid Panel  No results found for: CHOL, TRIG, HDL, CHOLHDL, VLDL, LDLCALC, LDLDIRECT  CBC    Component Value Date/Time   WBC 7.2 09/06/2020 1609   WBC 9.9 08/05/2020 0756   RBC 4.24 09/06/2020 1609   RBC 3.80 (L) 08/05/2020 0756   HGB 13.5 09/06/2020 1609   HGB 12.3 03/16/2020 0000   HCT 38.5 09/06/2020 1609   HCT 38 03/16/2020 0000   PLT 249 09/06/2020 1609   PLT 284 03/16/2020 0000   MCV 91 09/06/2020 1609   MCH 31.8 09/06/2020 1609   MCH 32.1 08/05/2020 0756   MCHC 35.1 09/06/2020 1609   MCHC 33.9 08/05/2020 0756   RDW 12.0 09/06/2020 1609   LYMPHSABS 2.5 05/23/2017 0746   MONOABS 0.3 05/23/2017 0746   EOSABS 0.1 05/23/2017 0746   BASOSABS 0.0 05/23/2017 0746    No results found for: HGBA1C  Assessment & Plan:  1. History of maternal hypertension Controlled on diet control Advised to continue with keeping a log of her blood pressures which will be reviewed at her next visit No medication indicated at this time Counseled on blood pressure goal of less than 130/80, low-sodium, DASH diet, medication compliance, 150 minutes of moderate intensity exercise per week. Discussed medication compliance, adverse effects.    No orders of the defined types were placed in this encounter.   Follow-up: Return in about 6 months (around 07/23/2021) for Coordination of care.       07/25/2021, MD, FAAFP. [facility] [place], [place]   01/23/2021, 9:45 AM

## 2021-01-23 NOTE — Progress Notes (Signed)
Hypertension while pregnant. OBGYN referrd her here

## 2021-07-11 ENCOUNTER — Telehealth: Payer: Medicaid Other

## 2021-07-11 ENCOUNTER — Telehealth: Payer: Self-pay

## 2021-07-11 ENCOUNTER — Telehealth: Payer: Self-pay | Admitting: *Deleted

## 2021-07-11 NOTE — Telephone Encounter (Signed)
Called Pt multiple times to start New OB Intake, no answers, VM full.Called Pts grandmother, no answer, left VM for Pt to call back.

## 2021-07-11 NOTE — Telephone Encounter (Signed)
Received voice message she missed a call from our number and thinks she had virtual visit . Per chart was scheduled for new ob intake a5 9:15 and staff attempted to reach her multiple times. Front office is rescheduling. Per chart is rescheduled for 07/16/21 at 3:15. I called Silveria and notifed her . She voices understanding. Frederick Klinger,RN

## 2021-07-16 ENCOUNTER — Telehealth: Payer: Medicaid Other

## 2021-07-16 ENCOUNTER — Telehealth: Payer: Self-pay

## 2021-07-16 NOTE — Telephone Encounter (Signed)
Called Pt to start New OB Intake, clld # ending [phone] left VM for call back,called # ending [phone], rcvd message stating ,call cannot be completed at this time, please hang up and call back. Called grandmothers # ending [phone], no answer, left VM for call back.

## 2021-07-16 NOTE — Telephone Encounter (Signed)
Called PT AGAIN ASKED IF I could give her 30 min & call back. So clld her back at 4 pm, no answer, left VM for her to just come to New ob office visit on 07/25/21 @ 2:55p with Dr. Vergie Living.

## 2021-07-16 NOTE — Progress Notes (Deleted)
New OB Intake  I connected with  Erica Todd on 07/16/21 at  3:15 PM EDT by {Contact:24193} Video Visit and verified that I am speaking with the correct person using two identifiers. Nurse is located at [facility] and pt is located at ***.  I discussed the limitations, risks, security and privacy concerns of performing an evaluation and management service by telephone and the availability of in person appointments. I also discussed with the patient that there may be a patient responsible charge related to this service. The patient expressed understanding and agreed to proceed.  I explained I am completing New OB Intake today. We discussed her EDD of *** that is based on LMP of ***. Pt is G***/P***. I reviewed her allergies, medications, Medical/Surgical/OB history, and appropriate screenings. I informed her of [facility] services. Based on history, this is a/an  pregnancy {Complicated/Uncomplicated Pregnancy:20185} .   Patient Active Problem List   Diagnosis Date Noted   Encounter for induction of labor 08/05/2020   Supervision of high risk pregnancy, antepartum 07/28/2020   Tobacco smoking affecting pregnancy in third trimester 07/28/2020   IUGR (intrauterine growth restriction) affecting care of mother 07/28/2020   History of herpes genitalis 07/28/2020    Concerns addressed today  Delivery Plans:  Plans to deliver at [facility].   MyChart/Babyscripts MyChart access verified. I explained pt will have some visits in office and some virtually. Babyscripts instructions given and order placed. Patient verifies receipt of registration text/e-mail. Account successfully created and app downloaded.  Blood Pressure Cuff  {blood pressure cuff:24241} Explained after first prenatal appt pt will check weekly and document in Babyscripts.  Weight scale: Patient    have weight scale. Weight scale ordered for patient to pick up form [facility].   Anatomy US Explained first scheduled [place] will be around 19  weeks. Anatomy US scheduled for *** at ***. Pt notified to arrive at ***.  Labs Discussed Avelina Laine genetic screening with patient. Would like both Panorama and Horizon drawn at new OB visit. Routine prenatal labs needed.  Covid Vaccine Patient {HAS/HAS NOT:20194} covid vaccine.   Mother/ Baby Dyad Candidate?    If yes, offer as possibility  Informed patient of [facility] website  and placed link in her AVS.   Social Determinants of Health Food Insecurity: {food:25541} [facility] Referral: Patient {ACTION; IS/IS CWC:[id number]} interested in referral to [facility].  Transportation: {transportation:25542} Childcare: Discussed no children allowed at ultrasound appointments. Offered childcare services; {childcare:25540}   Placed OB Box on problem list and updated  First visit review I reviewed new OB appt with pt. I explained she will have a pelvic exam, ob bloodwork with genetic screening, and PAP smear. Explained pt will be seen by *** at first visit; encounter routed to appropriate provider. Explained that patient will be seen by pregnancy navigator following visit with provider. [facility] information placed in AVS.   Henrietta Dine, CMA 07/16/2021  3:29 PM

## 2021-07-25 ENCOUNTER — Encounter: Payer: Self-pay | Admitting: Obstetrics and Gynecology

## 2021-07-25 ENCOUNTER — Other Ambulatory Visit (HOSPITAL_COMMUNITY)
Admission: RE | Admit: 2021-07-25 | Discharge: 2021-07-25 | Disposition: A | Discharge status: Home / Self Care | Payer: Medicaid Other | Source: Ambulatory Visit | Attending: Obstetrics and Gynecology | Admitting: Obstetrics and Gynecology

## 2021-07-25 ENCOUNTER — Other Ambulatory Visit: Payer: Self-pay

## 2021-07-25 ENCOUNTER — Ambulatory Visit (INDEPENDENT_AMBULATORY_CARE_PROVIDER_SITE_OTHER): Payer: Medicaid Other | Admitting: Obstetrics and Gynecology

## 2021-07-25 VITALS — BP 116/54 | HR 63 | Wt 145.7 lb

## 2021-07-25 DIAGNOSIS — Z8619 Personal history of other infectious and parasitic diseases: Secondary | ICD-10-CM

## 2021-07-25 DIAGNOSIS — Z3A12 12 weeks gestation of pregnancy: Secondary | ICD-10-CM

## 2021-07-25 DIAGNOSIS — O0990 Supervision of high risk pregnancy, unspecified, unspecified trimester: Secondary | ICD-10-CM | POA: Diagnosis not present

## 2021-07-25 DIAGNOSIS — O09899 Supervision of other high risk pregnancies, unspecified trimester: Secondary | ICD-10-CM | POA: Insufficient documentation

## 2021-07-25 DIAGNOSIS — Z8679 Personal history of other diseases of the circulatory system: Secondary | ICD-10-CM

## 2021-07-25 DIAGNOSIS — Z8759 Personal history of other complications of pregnancy, childbirth and the puerperium: Secondary | ICD-10-CM

## 2021-07-25 DIAGNOSIS — Z87898 Personal history of other specified conditions: Secondary | ICD-10-CM

## 2021-07-25 LAB — POCT PREGNANCY, URINE: Preg Test, Ur: POSITIVE — AB

## 2021-07-25 MED ORDER — ASPIRIN EC 81 MG PO TBEC: 81.0000 mg | DELAYED_RELEASE_TABLET | Freq: Every day | ORAL | 2 refills | Status: DC

## 2021-07-25 NOTE — Addendum Note (Signed)
Addended byVidal Schwalbe on: 07/25/2021 03:56 PM   Modules accepted: Orders

## 2021-07-25 NOTE — Progress Notes (Signed)
error 

## 2021-07-25 NOTE — Progress Notes (Signed)
New OB Note  07/25/2021   Clinic: [facility]  Chief Complaint: NOB  History of Present Illness: Ms. Lacaze is a 25 y.o. [id number] @ 12/2 weeks (EDC 3/6 [tentative], based on Patient's last menstrual period was 04/30/2021., uncertain).  Preg complicated by has Tobacco smoking affecting pregnancy in third trimester; History of FGR in prior pregnancy; History of herpes genitalis; Supervision of high risk pregnancy, antepartum; Short interval between pregnancies complicating pregnancy, antepartum; and History of postpartum hypertension on their problem list.   Any events prior to today's visit: no Her periods were: unknown She was using no method when she conceived.  She has Negative signs or symptoms of miscarriage or preterm labor On any medications around the time she conceived/early pregnancy: No   ROS: A 12-point review of systems was performed and negative, except as stated in the above HPI.  OBGYN History: As per HPI. OB History  Gravida Para Term Preterm AB Living  5 2 2  0 2 2  SAB IAB Ectopic Multiple Live Births  1 1 0 0 2    # Outcome Date GA Lbr Len/2nd Weight Sex Delivery Anes PTL Lv  5 Current           4 Term 08/06/20 [redacted]w[redacted]d / 00:18 4 lb 14.5 oz (2.225 kg) M Vag-Spont EPI  LIV     Birth Comments: wnl  3 Term 06/26/15 [redacted]w[redacted]d 11:21 / 00:32 5 lb 8.5 oz (2.509 kg) M Vag-Spont EPI  LIV  2 IAB           1 SAB              Birth Comments: System Generated. Please review and update pregnancy details.    Any issues with any prior pregnancies: FGR 37wk IOL and PP HTN with last pregnancy Prior children are healthy, doing well, and without any problems or issues: yes History of pap smears: Yes. Last pap smear 2021 and results were negative   Past Medical History: Past Medical History:  Diagnosis Date   Headache(784.0)     Past Surgical History: Past Surgical History:  Procedure Laterality Date   NO PAST SURGERIES      Family History:   Family History  Problem Relation Age of Onset   Alcohol abuse Neg Hx    Arthritis Neg Hx    Asthma Neg Hx    Birth defects Neg Hx    Cancer Neg Hx    COPD Neg Hx    Depression Neg Hx    Diabetes Neg Hx    Drug abuse Neg Hx    Early death Neg Hx    Hearing loss Neg Hx    Heart disease Neg Hx    Hyperlipidemia Neg Hx    Hypertension Neg Hx    Kidney disease Neg Hx    Learning disabilities Neg Hx    Mental illness Neg Hx    Mental retardation Neg Hx    Miscarriages / Stillbirths Neg Hx    Stroke Neg Hx    Vision loss Neg Hx    Varicose Veins Neg Hx     Social History:  Social History   Socioeconomic History   Marital status: Single    Spouse name: Not on file   Number of children: Not on file   Years of education: Not on file   Highest education level: Not on file  Occupational History   Not on file  Tobacco Use   Smoking status: Never  Smokeless tobacco: Never  Vaping Use   Vaping Use: Never used  Substance and Sexual Activity   Alcohol use: Not Currently   Drug use: Not Currently    Frequency: 2.0 times per week    Types: Marijuana    Comment: occassionally   Sexual activity: Yes  Other Topics Concern   Not on file  Social History Narrative   Not on file   Social Determinants of Health   Financial Resource Strain: Not on file  Food Insecurity: No Food Insecurity   Worried About Running Out of Food in the Last Year: Never true   Ran Out of Food in the Last Year: Never true  Transportation Needs: No Transportation Needs   Lack of Transportation (Medical): No   Lack of Transportation (Non-Medical): No  Physical Activity: Not on file  Stress: Not on file  Social Connections: Not on file  Intimate Partner Violence: Not on file    Allergy: No Known Allergies  Current Outpatient Medications: Prenatal vitamin  Physical Exam:   BP (!) 116/54   Pulse 63   Wt 145 lb 11.2 oz (66.1 kg)   LMP 04/30/2021   BMI 24.25 kg/m  Body mass index is 24.25  kg/m.   Vag. Bleeding: None. Fundal height: 14 FHTs: 140s  General appearance: Well nourished, well developed female in no acute distress.  Cardiovascular: S1, S2 normal, no murmur, rub or gallop, regular rate and rhythm Respiratory:  Clear to auscultation bilateral. Normal respiratory effort Abdomen: positive bowel sounds and no masses, hernias; diffusely non tender to palpation, non distended Neuro/Psych:  Normal mood and affect.  Skin:  Warm and dry.  Lymphatic:  No inguinal lymphadenopathy.   Pelvic exam: is not limited by body habitus EGBUS: within normal limits, Vagina: within normal limits and with no blood in the vault, Cervix: normal appearing cervix without discharge or lesions, closed/long/high, Uterus:  enlarged, c/w 12-14 week size, and Adnexa:  normal adnexa and no mass, fullness, tenderness  Laboratory: none  Imaging:  none  Assessment: pt stable  Plan: 1. Supervision of high risk pregnancy, antepartum Declines genetics. Offer afp nv. Anatomy u/s ordered today - Hemoglobin A1c - Culture, OB Urine - GC/Chlamydia probe amp ([place])not at [facility] - [place] MFM OB DETAIL +14 WK; Future - CBC/D/Plt+RPR+Rh+ABO+RubIgG... - Protein / creatinine ratio, urine - Comprehensive metabolic panel  2. History of postpartum hypertension Pt amenable to low dose asa.  3. [redacted] weeks gestation of pregnancy  4. History of FGR in prior pregnancy Serial growth u/s this pregnancy  5. History of herpes genitalis Ppx at 335-36wks  Problem list reviewed and updated.  Follow up in 3 weeks.  >50% of 30 min visit spent on counseling and coordination of care.     Cornelia Copa MD Attending Center for [facility])

## 2021-07-25 NOTE — Addendum Note (Signed)
Addended byVidal Schwalbe on: 07/25/2021 04:59 PM   Modules accepted: Orders

## 2021-07-26 ENCOUNTER — Encounter: Payer: Self-pay | Admitting: *Deleted

## 2021-07-26 LAB — CERVICOVAGINAL ANCILLARY ONLY
Bacterial Vaginitis (gardnerella): POSITIVE — AB
Candida Glabrata: NEGATIVE
Candida Vaginitis: NEGATIVE
Chlamydia: NEGATIVE
Comment: NEGATIVE
Comment: NEGATIVE
Comment: NEGATIVE
Comment: NEGATIVE
Comment: NEGATIVE
Comment: NORMAL
Neisseria Gonorrhea: NEGATIVE
Trichomonas: NEGATIVE

## 2021-07-26 LAB — PROTEIN / CREATININE RATIO, URINE
Creatinine, Urine: 131.1 mg/dL
Protein, Ur: 6.7 mg/dL
Protein/Creat Ratio: 51 mg/g creat (ref 0–200)

## 2021-07-27 LAB — URINE CULTURE, OB REFLEX

## 2021-07-27 LAB — CULTURE, OB URINE

## 2021-07-30 MED ORDER — METRONIDAZOLE 500 MG PO TABS: 500.0000 mg | ORAL_TABLET | Freq: Two times a day (BID) | ORAL | 0 refills | Status: AC

## 2021-07-30 NOTE — Addendum Note (Signed)
Addended by: Mounds Bing on: 07/30/2021 10:25 AM   Modules accepted: Orders

## 2021-08-15 ENCOUNTER — Other Ambulatory Visit: Payer: Self-pay

## 2021-08-15 ENCOUNTER — Ambulatory Visit (INDEPENDENT_AMBULATORY_CARE_PROVIDER_SITE_OTHER): Payer: Medicaid Other | Admitting: Certified Nurse Midwife

## 2021-08-15 VITALS — BP 106/63 | HR 69 | Wt 149.1 lb

## 2021-08-15 DIAGNOSIS — O0992 Supervision of high risk pregnancy, unspecified, second trimester: Secondary | ICD-10-CM | POA: Diagnosis not present

## 2021-08-15 DIAGNOSIS — Z3A15 15 weeks gestation of pregnancy: Secondary | ICD-10-CM | POA: Diagnosis not present

## 2021-08-15 DIAGNOSIS — N949 Unspecified condition associated with female genital organs and menstrual cycle: Secondary | ICD-10-CM | POA: Diagnosis not present

## 2021-08-15 NOTE — Progress Notes (Signed)
Patient complains of "pelvic cramps on left side", she stated that it mostly happens when she is being active and started maybe "a week ago". Ms Busker denies getting remainder of "initial  Prenatal" labs drawn today

## 2021-08-16 NOTE — Progress Notes (Signed)
   PRENATAL VISIT NOTE  Subjective:  Erica Todd is a 25 y.o. [phone] at [redacted]w[redacted]d being seen today for ongoing prenatal care.  She is currently monitored for the following issues for this high-risk pregnancy and has Tobacco smoking affecting pregnancy in third trimester; History of FGR in prior pregnancy; History of herpes genitalis; Supervision of high risk pregnancy, antepartum; Short interval between pregnancies complicating pregnancy, antepartum; and History of postpartum hypertension on their problem list.  Patient reports  left sided cramping when walking, no other complaints. Refused lab draw for new OB panel because they took 3 tries to get blood. Refuses again today .  Contractions: Not present. Vag. Bleeding: None.  Movement: Absent. Denies leaking of fluid.   The following portions of the patient's history were reviewed and updated as appropriate: allergies, current medications, past family history, past medical history, past social history, past surgical history and problem list.   Objective:   Vitals:   08/15/21 0935  BP: 106/63  Pulse: 69  Weight: 149 lb 1.6 oz (67.6 kg)    Fetal Status: Fetal Heart Rate (bpm): 140   Movement: Absent     General:  Alert, oriented and cooperative. Patient is in no acute distress.  Skin: Skin is warm and dry. No rash noted.   Cardiovascular: Normal heart rate noted  Respiratory: Normal respiratory effort, no problems with respiration noted  Abdomen: Soft, gravid, appropriate for gestational age.  Pain/Pressure: Present     Pelvic: Cervical exam deferred        Extremities: Normal range of motion.  Edema: None  Mental Status: Normal mood and affect. Normal behavior. Normal judgment and thought content.   Assessment and Plan:  Pregnancy: [id number] at [redacted]w[redacted]d 1. Encounter for supervision of high-risk pregnancy in second trimester - Doing well overall, starting to feel fetal movement - Discussed importance of blood tests, gave option of allowing  blood test only if they could get tubes on the first try. Pt declined, unwilling to let our lab people try again (RN confident she had a floating phlebotomist at her last appt). Offered to put in labs to be drawn at [facility] lab, pt agreed. - Hepatitis B surface antigen; Future - RPR; Future - CBC; Future - Differential; Future - Rubella screen; Future - Hemoglobin A1c; Future - HIV antibody (with reflex); Future - Hepatitis C Antibody; Future - Type and screen; Future  2. [redacted] weeks gestation of pregnancy - Routine OB care  3. Round ligament pain - Reviewed massage and stretches to relieve round ligament pain  Preterm labor symptoms and general obstetric precautions including but not limited to vaginal bleeding, contractions, leaking of fluid and fetal movement were reviewed in detail with the patient. Please refer to After Visit Summary for other counseling recommendations.   Return in about 4 weeks (around 09/12/2021) for IN-PERSON, LOB.  Future Appointments  Date Time Provider Department Center  09/10/2021 10:30 AM [facility] NURSE [facility]  09/10/2021 10:45 AM [facility]  09/12/2021 10:15 AM [place], Brand Males, NP [facility]   Bernerd Limbo, CNM

## 2021-08-24 ENCOUNTER — Other Ambulatory Visit: Payer: Self-pay

## 2021-08-24 ENCOUNTER — Inpatient Hospital Stay (HOSPITAL_COMMUNITY)
Admission: AD | Admit: 2021-08-24 | Discharge: 2021-08-24 | Disposition: A | Discharge status: Home / Self Care | Payer: Medicaid Other | Attending: Obstetrics and Gynecology | Admitting: Obstetrics and Gynecology

## 2021-08-24 ENCOUNTER — Other Ambulatory Visit (HOSPITAL_COMMUNITY)
Admission: AD | Admit: 2021-08-24 | Discharge: 2021-08-24 | Disposition: A | Discharge status: Home / Self Care | Payer: Medicaid Other | Attending: Certified Nurse Midwife | Admitting: Certified Nurse Midwife

## 2021-09-10 ENCOUNTER — Encounter: Payer: Self-pay | Admitting: *Deleted

## 2021-09-10 ENCOUNTER — Other Ambulatory Visit: Payer: Self-pay | Admitting: *Deleted

## 2021-09-10 ENCOUNTER — Other Ambulatory Visit: Payer: Self-pay

## 2021-09-10 ENCOUNTER — Ambulatory Visit: Payer: Medicaid Other | Attending: Obstetrics and Gynecology

## 2021-09-10 ENCOUNTER — Ambulatory Visit: Payer: Medicaid Other | Admitting: *Deleted

## 2021-09-10 VITALS — BP 113/58 | HR 65

## 2021-09-10 DIAGNOSIS — O09899 Supervision of other high risk pregnancies, unspecified trimester: Secondary | ICD-10-CM

## 2021-09-10 DIAGNOSIS — O0990 Supervision of high risk pregnancy, unspecified, unspecified trimester: Secondary | ICD-10-CM | POA: Insufficient documentation

## 2021-09-10 DIAGNOSIS — Z362 Encounter for other antenatal screening follow-up: Secondary | ICD-10-CM

## 2021-09-12 ENCOUNTER — Encounter: Payer: Medicaid Other | Admitting: Nurse Practitioner

## 2021-09-17 ENCOUNTER — Encounter: Payer: Self-pay | Admitting: Obstetrics and Gynecology

## 2021-09-17 DIAGNOSIS — O283 Abnormal ultrasonic finding on antenatal screening of mother: Secondary | ICD-10-CM | POA: Insufficient documentation

## 2021-10-08 ENCOUNTER — Encounter: Payer: Self-pay | Admitting: *Deleted

## 2021-10-08 ENCOUNTER — Other Ambulatory Visit: Payer: Self-pay | Admitting: *Deleted

## 2021-10-08 ENCOUNTER — Other Ambulatory Visit: Payer: Self-pay

## 2021-10-08 ENCOUNTER — Ambulatory Visit: Payer: Medicaid Other | Admitting: *Deleted

## 2021-10-08 ENCOUNTER — Ambulatory Visit: Payer: Medicaid Other | Attending: Obstetrics

## 2021-10-08 VITALS — BP 118/61 | HR 60

## 2021-10-08 DIAGNOSIS — Z3A24 24 weeks gestation of pregnancy: Secondary | ICD-10-CM | POA: Diagnosis not present

## 2021-10-08 DIAGNOSIS — O283 Abnormal ultrasonic finding on antenatal screening of mother: Secondary | ICD-10-CM | POA: Insufficient documentation

## 2021-10-08 DIAGNOSIS — Z362 Encounter for other antenatal screening follow-up: Secondary | ICD-10-CM | POA: Insufficient documentation

## 2021-10-08 DIAGNOSIS — O99332 Smoking (tobacco) complicating pregnancy, second trimester: Secondary | ICD-10-CM | POA: Diagnosis not present

## 2021-10-08 DIAGNOSIS — O09292 Supervision of pregnancy with other poor reproductive or obstetric history, second trimester: Secondary | ICD-10-CM

## 2021-11-19 ENCOUNTER — Ambulatory Visit: Payer: Medicaid Other | Attending: Obstetrics

## 2021-11-19 ENCOUNTER — Ambulatory Visit: Payer: Medicaid Other | Admitting: *Deleted

## 2021-11-19 ENCOUNTER — Other Ambulatory Visit: Payer: Self-pay

## 2021-11-19 ENCOUNTER — Encounter: Payer: Self-pay | Admitting: *Deleted

## 2021-11-19 ENCOUNTER — Other Ambulatory Visit: Payer: Self-pay | Admitting: *Deleted

## 2021-11-19 VITALS — BP 131/84 | HR 67

## 2021-11-19 DIAGNOSIS — O99333 Smoking (tobacco) complicating pregnancy, third trimester: Secondary | ICD-10-CM

## 2021-11-19 DIAGNOSIS — Z8759 Personal history of other complications of pregnancy, childbirth and the puerperium: Secondary | ICD-10-CM

## 2021-11-19 DIAGNOSIS — Z3A30 30 weeks gestation of pregnancy: Secondary | ICD-10-CM

## 2021-11-19 DIAGNOSIS — O358XX0 Maternal care for other (suspected) fetal abnormality and damage, not applicable or unspecified: Secondary | ICD-10-CM | POA: Diagnosis not present

## 2021-11-19 DIAGNOSIS — O09293 Supervision of pregnancy with other poor reproductive or obstetric history, third trimester: Secondary | ICD-10-CM | POA: Diagnosis not present

## 2021-11-19 DIAGNOSIS — O09292 Supervision of pregnancy with other poor reproductive or obstetric history, second trimester: Secondary | ICD-10-CM | POA: Insufficient documentation

## 2021-11-19 DIAGNOSIS — O09893 Supervision of other high risk pregnancies, third trimester: Secondary | ICD-10-CM

## 2021-11-19 DIAGNOSIS — O283 Abnormal ultrasonic finding on antenatal screening of mother: Secondary | ICD-10-CM

## 2021-12-02 NOTE — L&D Delivery Note (Addendum)
° °  Delivery Note Called to room and patient was complete and urge to push after epidural. Head delivered 0609. No nuchal cord present. Shoulder and body delivered in usual fashion. At 0610 a viable female was delivered via Vaginal, Spontaneous (Presentation: ROA).  Infant with spontaneous cry, placed on mother's abdomen, dried and stimulated. Cord clamped x 2 after 1-minute delay, and cut by Raelyn Number, SNM. Cord blood drawn. Placenta delivered spontaneously with gentle cord traction. Appears intact. Fundus firm with massage and Pitocin. Labia, perineum, vagina, and cervix inspected with 4x4 gauze. 1st degree vaginal lac noted, bled briskly despite pressure.  Would still bleed w/gauze packing. Patient did not want to have perineal laceration repaired due to anxiety regarding numbing.  Applied pressure as laceration continued to bleed. Patient consented to laceration repair after pressing her epidural PCA button and waiting 20 minutes while it kicked in.     APGAR: 9 ,9; weight: 2670 g Cord: 3VC with the following complications: None.     Anesthesia: Epidural   Episiotomy: None Lacerations: 1st degree perineal Suture Repair: 3.0 vicryl rapide Est. Blood Loss (mL): 537  Mom to postpartum.  Baby girl Alaya to Couplet care / Skin to Skin.  [place], [place] 01/08/22 6:56 AM     The above was performed under my direct supervision and guidance.

## 2021-12-31 ENCOUNTER — Encounter: Payer: Self-pay | Admitting: *Deleted

## 2021-12-31 ENCOUNTER — Ambulatory Visit: Payer: Medicaid Other | Admitting: *Deleted

## 2021-12-31 ENCOUNTER — Ambulatory Visit: Payer: Medicaid Other | Attending: Obstetrics and Gynecology

## 2021-12-31 ENCOUNTER — Other Ambulatory Visit: Payer: Self-pay

## 2021-12-31 VITALS — BP 122/74 | HR 71

## 2021-12-31 DIAGNOSIS — Z3689 Encounter for other specified antenatal screening: Secondary | ICD-10-CM

## 2021-12-31 DIAGNOSIS — O283 Abnormal ultrasonic finding on antenatal screening of mother: Secondary | ICD-10-CM | POA: Diagnosis present

## 2021-12-31 DIAGNOSIS — Z3A36 36 weeks gestation of pregnancy: Secondary | ICD-10-CM | POA: Diagnosis not present

## 2021-12-31 DIAGNOSIS — O09293 Supervision of pregnancy with other poor reproductive or obstetric history, third trimester: Secondary | ICD-10-CM | POA: Insufficient documentation

## 2022-01-08 ENCOUNTER — Encounter (HOSPITAL_COMMUNITY): Payer: Self-pay | Admitting: Obstetrics & Gynecology

## 2022-01-08 ENCOUNTER — Inpatient Hospital Stay (HOSPITAL_COMMUNITY): Payer: Medicaid Other | Admitting: Anesthesiology

## 2022-01-08 ENCOUNTER — Inpatient Hospital Stay (HOSPITAL_COMMUNITY)
Admission: AD | Admit: 2022-01-08 | Discharge: 2022-01-10 | DRG: 807 | Disposition: A | Discharge status: Home / Self Care | Payer: Medicaid Other | Attending: Obstetrics & Gynecology | Admitting: Obstetrics & Gynecology

## 2022-01-08 ENCOUNTER — Telehealth: Payer: Self-pay

## 2022-01-08 DIAGNOSIS — O0990 Supervision of high risk pregnancy, unspecified, unspecified trimester: Secondary | ICD-10-CM

## 2022-01-08 DIAGNOSIS — Z20822 Contact with and (suspected) exposure to covid-19: Secondary | ICD-10-CM | POA: Diagnosis present

## 2022-01-08 DIAGNOSIS — Z8759 Personal history of other complications of pregnancy, childbirth and the puerperium: Secondary | ICD-10-CM

## 2022-01-08 DIAGNOSIS — O09899 Supervision of other high risk pregnancies, unspecified trimester: Secondary | ICD-10-CM

## 2022-01-08 DIAGNOSIS — O99334 Smoking (tobacco) complicating childbirth: Secondary | ICD-10-CM

## 2022-01-08 DIAGNOSIS — O99333 Smoking (tobacco) complicating pregnancy, third trimester: Secondary | ICD-10-CM | POA: Diagnosis present

## 2022-01-08 DIAGNOSIS — Z3A37 37 weeks gestation of pregnancy: Secondary | ICD-10-CM

## 2022-01-08 DIAGNOSIS — Z8619 Personal history of other infectious and parasitic diseases: Secondary | ICD-10-CM | POA: Diagnosis present

## 2022-01-08 DIAGNOSIS — Z8679 Personal history of other diseases of the circulatory system: Secondary | ICD-10-CM

## 2022-01-08 DIAGNOSIS — Z87898 Personal history of other specified conditions: Secondary | ICD-10-CM

## 2022-01-08 DIAGNOSIS — O283 Abnormal ultrasonic finding on antenatal screening of mother: Secondary | ICD-10-CM | POA: Diagnosis present

## 2022-01-08 DIAGNOSIS — O26893 Other specified pregnancy related conditions, third trimester: Principal | ICD-10-CM | POA: Diagnosis present

## 2022-01-08 LAB — CBC
HCT: 38 % (ref 36.0–46.0)
Hemoglobin: 13.2 g/dL (ref 12.0–15.0)
MCH: 31.5 pg (ref 26.0–34.0)
MCHC: 34.7 g/dL (ref 30.0–36.0)
MCV: 90.7 fL (ref 80.0–100.0)
Platelets: 229 10*3/uL (ref 150–400)
RBC: 4.19 MIL/uL (ref 3.87–5.11)
RDW: 13.2 % (ref 11.5–15.5)
WBC: 9 10*3/uL (ref 4.0–10.5)
nRBC: 0 % (ref 0.0–0.2)

## 2022-01-08 LAB — GROUP B STREP BY PCR: Group B strep by PCR: NEGATIVE

## 2022-01-08 LAB — TYPE AND SCREEN
ABO/RH(D): O POS
Antibody Screen: NEGATIVE

## 2022-01-08 LAB — RESP PANEL BY RT-PCR (FLU A&B, COVID) ARPGX2
Influenza A by PCR: NEGATIVE
Influenza B by PCR: NEGATIVE
SARS Coronavirus 2 by RT PCR: NEGATIVE

## 2022-01-08 LAB — HEPATITIS B SURFACE ANTIGEN: Hepatitis B Surface Ag: NONREACTIVE

## 2022-01-08 LAB — RPR: RPR Ser Ql: NONREACTIVE

## 2022-01-08 MED ORDER — ONDANSETRON HCL 4 MG/2ML IJ SOLN: 4.0000 mg | Freq: Four times a day (QID) | INTRAMUSCULAR | Status: DC | PRN

## 2022-01-08 MED ORDER — TETANUS-DIPHTH-ACELL PERTUSSIS 5-2.5-18.5 LF-MCG/0.5 IM SUSY: 0.5000 mL | PREFILLED_SYRINGE | Freq: Once | INTRAMUSCULAR | Status: DC

## 2022-01-08 MED ORDER — FERROUS SULFATE 325 (65 FE) MG PO TABS
325.0000 mg | ORAL_TABLET | ORAL | Status: DC
  Administered 2022-01-08 – 2022-01-10 (×2): 325 mg via ORAL
  Filled 2022-01-08 (×2): qty 1

## 2022-01-08 MED ORDER — MEASLES, MUMPS & RUBELLA VAC IJ SOLR: 0.5000 mL | Freq: Once | INTRAMUSCULAR | Status: DC

## 2022-01-08 MED ORDER — OXYTOCIN-SODIUM CHLORIDE 30-0.9 UT/500ML-% IV SOLN: 2.5000 [IU]/h | INTRAVENOUS | Status: DC

## 2022-01-08 MED ORDER — ACETAMINOPHEN 325 MG PO TABS: 650.0000 mg | ORAL_TABLET | ORAL | Status: DC | PRN

## 2022-01-08 MED ORDER — POLYVINYL ALCOHOL 1.4 % OP SOLN
1.0000 [drp] | OPHTHALMIC | Status: DC | PRN
  Filled 2022-01-08: qty 15

## 2022-01-08 MED ORDER — MEDROXYPROGESTERONE ACETATE 150 MG/ML IM SUSP: 150.0000 mg | INTRAMUSCULAR | Status: DC | PRN

## 2022-01-08 MED ORDER — METHYLERGONOVINE MALEATE 0.2 MG PO TABS: 0.2000 mg | ORAL_TABLET | ORAL | Status: DC | PRN

## 2022-01-08 MED ORDER — ONDANSETRON HCL 4 MG PO TABS: 4.0000 mg | ORAL_TABLET | ORAL | Status: DC | PRN

## 2022-01-08 MED ORDER — OXYTOCIN BOLUS FROM INFUSION: 333.0000 mL | Freq: Once | INTRAVENOUS | Status: AC

## 2022-01-08 MED ORDER — PRENATAL MULTIVITAMIN CH
1.0000 | ORAL_TABLET | Freq: Every day | ORAL | Status: DC
  Administered 2022-01-08 – 2022-01-10 (×3): 1 via ORAL
  Filled 2022-01-08 (×3): qty 1

## 2022-01-08 MED ORDER — FENTANYL-BUPIVACAINE-NACL 0.5-0.125-0.9 MG/250ML-% EP SOLN
EPIDURAL | Status: AC
  Filled 2022-01-08: qty 250

## 2022-01-08 MED ORDER — FENTANYL-BUPIVACAINE-NACL 0.5-0.125-0.9 MG/250ML-% EP SOLN
EPIDURAL | Status: DC | PRN
  Administered 2022-01-08: 12 mL/h via EPIDURAL

## 2022-01-08 MED ORDER — OXYTOCIN-SODIUM CHLORIDE 30-0.9 UT/500ML-% IV SOLN
INTRAVENOUS | Status: AC
  Administered 2022-01-08: 333 mL via INTRAVENOUS
  Filled 2022-01-08: qty 500

## 2022-01-08 MED ORDER — OXYCODONE-ACETAMINOPHEN 5-325 MG PO TABS: 2.0000 | ORAL_TABLET | ORAL | Status: DC | PRN

## 2022-01-08 MED ORDER — LACTATED RINGERS IV SOLN: INTRAVENOUS | Status: DC

## 2022-01-08 MED ORDER — SOD CITRATE-CITRIC ACID 500-334 MG/5ML PO SOLN: 30.0000 mL | ORAL | Status: DC | PRN

## 2022-01-08 MED ORDER — LIDOCAINE HCL (PF) 1 % IJ SOLN
30.0000 mL | INTRAMUSCULAR | Status: AC | PRN
  Administered 2022-01-08: 30 mL via SUBCUTANEOUS
  Filled 2022-01-08: qty 30

## 2022-01-08 MED ORDER — LACTATED RINGERS IV SOLN: 500.0000 mL | INTRAVENOUS | Status: DC | PRN

## 2022-01-08 MED ORDER — WITCH HAZEL-GLYCERIN EX PADS: 1.0000 "application " | MEDICATED_PAD | CUTANEOUS | Status: DC | PRN

## 2022-01-08 MED ORDER — DOCUSATE SODIUM 100 MG PO CAPS
100.0000 mg | ORAL_CAPSULE | Freq: Two times a day (BID) | ORAL | Status: DC
  Administered 2022-01-09 – 2022-01-10 (×4): 100 mg via ORAL
  Filled 2022-01-08 (×4): qty 1

## 2022-01-08 MED ORDER — COCONUT OIL OIL: 1.0000 "application " | TOPICAL_OIL | Status: DC | PRN

## 2022-01-08 MED ORDER — OXYCODONE-ACETAMINOPHEN 5-325 MG PO TABS: 1.0000 | ORAL_TABLET | ORAL | Status: DC | PRN

## 2022-01-08 MED ORDER — DIBUCAINE (PERIANAL) 1 % EX OINT: 1.0000 "application " | TOPICAL_OINTMENT | CUTANEOUS | Status: DC | PRN

## 2022-01-08 MED ORDER — BENZOCAINE-MENTHOL 20-0.5 % EX AERO
1.0000 "application " | INHALATION_SPRAY | CUTANEOUS | Status: DC | PRN
  Administered 2022-01-08: 1 via TOPICAL
  Filled 2022-01-08: qty 56

## 2022-01-08 MED ORDER — SIMETHICONE 80 MG PO CHEW: 80.0000 mg | CHEWABLE_TABLET | ORAL | Status: DC | PRN

## 2022-01-08 MED ORDER — ONDANSETRON HCL 4 MG/2ML IJ SOLN: 4.0000 mg | INTRAMUSCULAR | Status: DC | PRN

## 2022-01-08 MED ORDER — FLEET ENEMA 7-19 GM/118ML RE ENEM: 1.0000 | ENEMA | Freq: Every day | RECTAL | Status: DC | PRN

## 2022-01-08 MED ORDER — BISACODYL 10 MG RE SUPP: 10.0000 mg | Freq: Every day | RECTAL | Status: DC | PRN

## 2022-01-08 MED ORDER — FENTANYL CITRATE (PF) 100 MCG/2ML IJ SOLN: 50.0000 ug | Freq: Once | INTRAMUSCULAR | Status: DC

## 2022-01-08 MED ORDER — LIDOCAINE HCL (PF) 1 % IJ SOLN
INTRAMUSCULAR | Status: DC | PRN
Start: 2022-01-08 — End: 2022-01-08
  Administered 2022-01-08: 5 mL via EPIDURAL

## 2022-01-08 MED ORDER — METHYLERGONOVINE MALEATE 0.2 MG/ML IJ SOLN: 0.2000 mg | INTRAMUSCULAR | Status: DC | PRN

## 2022-01-08 MED ORDER — DIPHENHYDRAMINE HCL 25 MG PO CAPS: 25.0000 mg | ORAL_CAPSULE | Freq: Four times a day (QID) | ORAL | Status: DC | PRN

## 2022-01-08 MED ORDER — IBUPROFEN 600 MG PO TABS
600.0000 mg | ORAL_TABLET | Freq: Four times a day (QID) | ORAL | Status: DC
  Administered 2022-01-08 – 2022-01-10 (×9): 600 mg via ORAL
  Filled 2022-01-08 (×9): qty 1

## 2022-01-08 NOTE — Discharge Summary (Signed)
Postpartum Discharge Summary  Date of Service updated 01/09/22     Patient Name: Erica Todd DOB: 1996/02/14 MRN: [id number]  Date of admission: 01/08/2022 Delivery date:01/08/2022  Delivering provider: WHITE, Ubaldo Glassing D  Date of discharge: 01/09/2022  Admitting diagnosis: Labor and delivery indication for care or intervention [O75.9] Intrauterine pregnancy: [redacted]w[redacted]d    Secondary diagnosis:  Principal Problem:   Labor and delivery indication for care or intervention  Additional problems: none    Discharge diagnosis: Term Pregnancy Delivered                                              Post partum procedures: none Augmentation: none Complications: None  Hospital course: Onset of Labor With Vaginal Delivery      26y.o. yo [id number] admitted in Active Labor on 01/08/2022. Patient had an uncomplicated labor course as follows:  Membrane Rupture Time/Date: 5:59 AM ,01/08/2022   Delivery Method:Vaginal, Spontaneous  Episiotomy: None  Lacerations:  1st degree  Patient had an uncomplicated postpartum course.  She is ambulating, tolerating a regular diet, passing flatus, and urinating well. Patient is discharged home in stable condition on 01/09/22.  Newborn Data: Birth date:01/08/2022  Birth time:6:10 AM  Gender:Female  Living status:Living  Apgars:9 ,9  Weight:2670 g   Magnesium Sulfate received: No BMZ received: No Rhophylac:N/A MMR:N/A T-DaP: if desired before d/c Flu: if desired before d/c Transfusion:No  Physical exam  Vitals:   01/08/22 1255 01/08/22 1651 01/08/22 2015 01/09/22 0540  BP: 128/73 131/77 126/75 117/73  Pulse: (!) 51 65 (!) 54 66  Resp: _0 Temp: 98.1 F (36.7 C) 98.1 F (36.7 C) 98.2 F (36.8 C)   TempSrc:  Oral    SpO2: 100% 100% 100%   Weight:      Height:       General: alert, cooperative, and no distress Lochia: appropriate Uterine Fundus: firm Incision: N/A DVT Evaluation: No evidence of DVT seen on physical exam. Negative  Homan's sign. No cords or calf tenderness. No significant calf/ankle edema. Labs: Lab Results  Component Value Date   WBC 9.0 01/08/2022   HGB 13.2 01/08/2022   HCT 38.0 01/08/2022   MCV 90.7 01/08/2022   PLT 229 01/08/2022   CMP Latest Ref Rng & Units 09/06/2020  Glucose 65 - 99 mg/dL 71  BUN 6 - 20 mg/dL 9  Creatinine 0.57 - 1.00 mg/dL 0.84  Sodium 134 - 144 mmol/L 139  Potassium 3.5 - 5.2 mmol/L 3.9  Chloride 96 - 106 mmol/L 102  CO2 20 - 29 mmol/L 23  Calcium 8.7 - 10.2 mg/dL 9.5  Total Protein 6.0 - 8.5 g/dL 7.4  Total Bilirubin 0.0 - 1.2 mg/dL 0.3  Alkaline Phos 44 - 121 IU/L 76  AST 0 - 40 IU/L 13  ALT 0 - 32 IU/L 9   Edinburgh Score: Edinburgh Postnatal Depression Scale Screening Tool 01/08/2022  I have been able to laugh and see the funny side of things. 0  I have looked forward with enjoyment to things. 0  I have blamed myself unnecessarily when things went wrong. 1  I have been anxious or worried for no good reason. 0  I have felt scared or panicky for no good reason. 1  Things have been getting on top of me. 1  I have been  so unhappy that I have had difficulty sleeping. 0  I have felt sad or miserable. 0  I have been so unhappy that I have been crying. 0  The thought of harming myself has occurred to me. 0  Edinburgh Postnatal Depression Scale Total 3     After visit meds:  Allergies as of 01/09/2022   No Known Allergies      Medication List     STOP taking these medications    acetaminophen 325 MG tablet Commonly known as: Tylenol   aspirin EC 81 MG tablet   prenatal multivitamin Tabs tablet       TAKE these medications    ibuprofen 600 MG tablet Commonly known as: ADVIL Take 1 tablet (600 mg total) by mouth every 6 (six) hours as needed for mild pain, moderate pain or cramping. What changed: reasons to take this         Discharge home in stable condition Infant Feeding: Bottle Infant Disposition:home with mother pending d/c from  peds Discharge instruction: per After Visit Summary and Postpartum booklet. Activity: Advance as tolerated. Pelvic rest for 6 weeks.  Diet: routine diet Future Appointments: Future Appointments  Date Time Provider [place]  02/05/2022 10:35 AM Darlina Rumpf, CNM [facility]   Follow up Visit:  [place] [facility] [place] [facility]. Call.   Specialty: Obstetrics and Gynecology Why: 4-6 weeks for your postpartum visit Contact information: [place] [address] [phone]                 Please schedule this patient for a Virtual postpartum visit in 4 weeks with the following provider: Any provider. Additional Postpartum F/U:  Low risk pregnancy complicated by:  Delivery mode:  Vaginal, Spontaneous  Anticipated Birth Control:   declines   01/09/2022 Roma Schanz, CNM

## 2022-01-08 NOTE — MAU Note (Signed)
PT SAYS STRONG SINCE 0300 NO VE IN OFFIC E UNSURE GBS HX OF  [facility] DENIES ANY S/S- NOT TAKING ANY MEDS

## 2022-01-08 NOTE — Anesthesia Preprocedure Evaluation (Addendum)
Anesthesia Evaluation  Patient identified by MRN, date of birth, ID band Patient awake    Reviewed: Allergy & Precautions, NPO status , Patient's Chart, lab work & pertinent test results  Airway Mallampati: II  TM Distance: >3 FB Neck ROM: Full    Dental no notable dental hx. (+) Teeth Intact, Dental Advisory Given   Pulmonary neg pulmonary ROS,    Pulmonary exam normal breath sounds clear to auscultation       Cardiovascular Exercise Tolerance: Good Normal cardiovascular exam Rhythm:Regular Rate:Normal     Neuro/Psych  Headaches,    GI/Hepatic negative GI ROS, Neg liver ROS,   Endo/Other    Renal/GU negative Renal ROS     Musculoskeletal negative musculoskeletal ROS (+)   Abdominal   Peds  Hematology Lab Results      Component                Value               Date                           HGB                      13.2                01/08/2022                HCT                      38.0                01/08/2022                PLT                      229                 01/08/2022              Anesthesia Other Findings   Reproductive/Obstetrics (+) Pregnancy                             Anesthesia Physical Anesthesia Plan  ASA: 2  Anesthesia Plan: Epidural   Post-op Pain Management:    Induction:   PONV Risk Score and Plan:   Airway Management Planned:   Additional Equipment:   Intra-op Plan:   Post-operative Plan:   Informed Consent: I have reviewed the patients History and Physical, chart, labs and discussed the procedure including the risks, benefits and alternatives for the proposed anesthesia with the patient or authorized representative who has indicated his/her understanding and acceptance.       Plan Discussed with:   Anesthesia Plan Comments: (37.5 wk g5P2 for LEA)       Anesthesia Quick Evaluation

## 2022-01-08 NOTE — Anesthesia Postprocedure Evaluation (Signed)
Anesthesia Post Note  Patient: Shanitha Twining  Procedure(s) Performed: AN AD HOC LABOR EPIDURAL     Patient location during evaluation: Mother Baby Anesthesia Type: Epidural Level of consciousness: awake, oriented and awake and alert Pain management: pain level controlled Vital Signs Assessment: post-procedure vital signs reviewed and stable Respiratory status: respiratory function stable, spontaneous breathing and nonlabored ventilation Cardiovascular status: stable Postop Assessment: no headache, adequate PO intake, able to ambulate, patient able to bend at knees, no backache and no apparent nausea or vomiting Anesthetic complications: no   No notable events documented.  Last Vitals:  Vitals:   01/08/22 0932 01/08/22 1255  BP: 128/79 128/73  Pulse: (!) 58 (!) 51  Resp: 18 18  Temp: 36.7 C 36.7 C  SpO2: 99% 100%    Last Pain:  Vitals:   01/08/22 1255  TempSrc:   PainSc: 0-No pain   Pain Goal:                   Karis Rilling

## 2022-01-08 NOTE — Telephone Encounter (Signed)
Patient left VM on nurse line requesting call back to discuss induction of labor and next appt needed. Pt reports she is 37 weeks.   Per chart review, pt delivered this morning at 0610.

## 2022-01-08 NOTE — H&P (Addendum)
OB ADMISSION/ HISTORY & PHYSICAL:  Admission Date: 01/08/2022  4:23 AM  Admit Diagnosis: Labor and delivery indication for care or intervention [O75.9]    Erica Todd is a 26 y.o. female at 41w5dpresenting for spontaneous labor ctxs started at 028 Patient reports positive fetal movement, denies vaginal bleeding and LOF. Patient denies vision changes, HA, and RUQ pain. Patient anticipating the arrival of baby girl Alaya.  Prenatal History: [id number]  EDC : 01/24/2022, by Ultrasound  Prenatal care at [facility] Prenatal course complicated by: -Limited prenatal care -Hx FGR in prior pregnancy  -Hx herpes genitalis -Short interval between pregnancies   Prenatal Labs: Nursing Staff Provider  Office Location  [facility] Dating   20wk u/s  Language  English  Anatomy [place] '[ ]'  f/u Nov completion u/s  Flu Vaccine    Genetic/Carrier Screen  NIPS: declined AFP:    Horizon: declined  TDaP Vaccine    Hgb A1C or  GTT Early  Third trimester   COVID Vaccine     LAB RESULTS   Rhogam    Blood Type --/--/O POS (09/04 0756)   Baby Feeding Plan Bottle Antibody NEG (09/04 0756)  Contraception   Rubella    Circumcision If boy Yes RPR NON REACTIVE (09/04 0756)   Pediatrician  [facility]     Support Person FOB HCVAb    Prenatal Classes   HIV       BTL Consent   GBS Negative/-- (08/27 1015) (For PCN allergy, check sensitivities)   VBAC Consent   Pap             DME Rx '[ ]'  BP cuff '[ ]'  Weight Scale Waterbirth  '[ ]'  Class '[ ]'  Consent '[ ]'  CNM visit  PHQ9 & GAD7 [  ] new OB [  ] 28 weeks  [  ] 36 weeks Induction  '[ ]'  Orders Entered '[ ]' Foley Y/N    Medical / Surgical History : Past medical history:  Past Medical History:  Diagnosis Date   Headache(784.0)     Past surgical history:  Past Surgical History:  Procedure Laterality Date   NO PAST SURGERIES      Family History:  Family History  Problem Relation Age of Onset   Alcohol abuse Neg Hx    Arthritis Neg Hx    Asthma Neg Hx    Birth  defects Neg Hx    Cancer Neg Hx    COPD Neg Hx    Depression Neg Hx    Diabetes Neg Hx    Drug abuse Neg Hx    Early death Neg Hx    Hearing loss Neg Hx    Heart disease Neg Hx    Hyperlipidemia Neg Hx    Hypertension Neg Hx    Kidney disease Neg Hx    Learning disabilities Neg Hx    Mental illness Neg Hx    Mental retardation Neg Hx    Miscarriages / Stillbirths Neg Hx    Stroke Neg Hx    Vision loss Neg Hx    Varicose Veins Neg Hx     Social History:  reports that she has never smoked. She has never used smokeless tobacco. She reports that she does not currently use alcohol. She reports that she does not currently use drugs after having used the following drugs: Marijuana. Frequency: 2.00 times per week. Allergies: Patient has no known allergies.   Current Medications at time of admission:  Medications  Prior to Admission  Medication Sig Dispense Refill Last Dose   acetaminophen (TYLENOL) 325 MG tablet Take 2 tablets (650 mg total) by mouth every 4 (four) hours as needed (for pain scale < 4). (Patient not taking: No sig reported)      aspirin EC 81 MG tablet Take 1 tablet (81 mg total) by mouth daily. (Patient not taking: Reported on 10/08/2021) 60 tablet 2    ibuprofen (ADVIL) 600 MG tablet Take 1 tablet (600 mg total) by mouth every 6 (six) hours as needed. (Patient not taking: No sig reported) 30 tablet 0    Prenatal Vit-Fe Fumarate-FA (PRENATAL MULTIVITAMIN) TABS tablet Take 1 tablet by mouth daily at 12 noon. [place]        Review of Systems: Review of Systems  Constitutional: Negative.   HENT: Negative.    Respiratory: Negative.    Gastrointestinal: Negative.   Genitourinary: Negative.   Musculoskeletal: Negative.   Neurological: Negative.   Psychiatric/Behavioral: Negative.     Physical Exam: Vital signs and nursing notes reviewed.  Patient Vitals for the past 24 hrs:  BP Temp Pulse Resp Height Weight  01/08/22 0433 126/71 98.1 F (36.7 C) 70 (!) 22 '5\' 5"'   (1.651 m) 74.6 kg     General: AAO x 3, NAD Heart: RRR Lungs:CTAB Abdomen: Gravid, NT Extremities: no edema Genitalia / VE: Dilation: 6 Effacement (%): 80 Station: -1 Presentation: Vertex Exam by:: Ralene Muskrat, RN   FHR: 125 BPM, moderate variability, present accels, absent decels TOCO: Contractions Q 1-5 mins  Labs:   Pending T&S, CBC, RPR  No results for input(s): WBC, HGB, HCT, PLT in the last 72 hours.  Assessment:  26 y.o. [id number] at 46w5dhere for spontaneous labor  1. 1st stage of labor 2. FHR category 1 3. GBS pending,  prenatal labs pending 4. Desires epidural 5. Plans to bottle feed 6. No active lesions, states last outbreak was 2 years ago  Plan:  1. Admit to BS 2. Routine L&D orders 3. Analgesia/anesthesia PRN  4. Expectant management 5. Anticipate NSVB   AADYA WIRZ SNM 01/08/2022, 5:06 AM   I personally saw and evaluated the patient, performing the key elements of the service. I developed and verified the management plan that is described in the resident's/student's note, and I agree with the content with my edits above. VSS, HRR&R, Resp unlabored, Legs neg.  FNigel Berthold CNM 01/08/2022 7:06 AM

## 2022-01-08 NOTE — Anesthesia Procedure Notes (Signed)
Epidural Patient location during procedure: OB Start time: 01/08/2022 5:42 AM End time: 01/08/2022 5:55 AM  Staffing Anesthesiologist: Barnet Glasgow, MD Performed: anesthesiologist   Preanesthetic Checklist Completed: patient identified, IV checked, site marked, risks and benefits discussed, surgical consent, monitors and equipment checked, pre-op evaluation and timeout performed  Epidural Patient position: sitting Prep: DuraPrep and site prepped and draped Patient monitoring: continuous pulse ox and blood pressure Approach: midline Location: L3-L4 Injection technique: LOR air  Needle:  Needle type: Tuohy  Needle gauge: 17 G Needle length: 9 cm and 9 Needle insertion depth: 6 cm Catheter type: closed end flexible Catheter size: 19 Gauge Catheter at skin depth: 11 cm Test dose: negative  Assessment Events: blood not aspirated, injection not painful, no injection resistance, no paresthesia and negative IV test  Additional Notes Patient identified. Risks/Benefits/Options discussed with patient including but not limited to bleeding, infection, nerve damage, paralysis, failed block, incomplete pain control, headache, blood pressure changes, nausea, vomiting, reactions to medication both or allergic, itching and postpartum back pain. Confirmed with bedside nurse the patient's most recent platelet count. Confirmed with patient that they are not currently taking any anticoagulation, have any bleeding history or any family history of bleeding disorders. Patient expressed understanding and wished to proceed. All questions were answered. Sterile technique was used throughout the entire procedure. Please see nursing notes for vital signs. Test dose was given through epidural needle and negative prior to continuing to dose epidural or start infusion. Warning signs of high block given to the patient including shortness of breath, tingling/numbness in hands, complete motor block, or any concerning  symptoms with instructions to call for help. Patient was given instructions on fall risk and not to get out of bed. All questions and concerns addressed with instructions to call with any issues.  1Attempt (S) . Patient tolerated procedure well.

## 2022-01-09 LAB — RUBELLA SCREEN: Rubella: 5.55 index (ref >0.99)

## 2022-01-09 MED ORDER — IBUPROFEN 600 MG PO TABS: 600.0000 mg | ORAL_TABLET | Freq: Four times a day (QID) | ORAL | 0 refills | Status: DC | PRN

## 2022-01-09 NOTE — Clinical Social Work Maternal (Signed)
°CLINICAL SOCIAL WORK MATERNAL/CHILD NOTE ° °Patient Details  °Name: Erica Todd °MRN: [id number] °Date of Birth: 11/01/1996 ° °Date:  01/09/2022 ° °Clinical Social Worker Initiating Note:  Malania Gawthrop, LCSW Date/Time: Initiated:  01/09/22/0945    ° °Child's Name:  Alaya Munoz  ° °Biological Parents:  Mother, Father (Shawonda Goodner 10/27/1997, Walter Woods 01-06-1978)  ° °Need for Interpreter:  None  ° °Reason for Referral:  Current Substance Use/Substance Use During Pregnancy  , Other (Comment) ("Does not live with other children")  ° °Address:  [address] °[place] [address]  °  °Phone number:  There are no phone numbers on file.   ° °Additional phone number:  ° °Household Members/Support Persons (HM/SP):   Household Member/Support Person 1, Household Member/Support Person 2, Household Member/Support Person 3 ° ° °HM/SP Name Relationship DOB or Age  °HM/SP -1 Jakarie McCray Son 08-06-2020  °HM/SP -2 Ja'Cion Farrow Son 06-26-2015  °HM/SP -3 Theron McCray Jakarie's Father 03-14-1955  °HM/SP -4        °HM/SP -5        °HM/SP -6        °HM/SP -7        °HM/SP -8        ° ° °Natural Supports (not living in the home):  Children, Immediate Family  ° °Professional Supports: Other (Comment) (Counselor Cassandra at [facility])  ° °Employment: Unemployed  ° °Type of Work:    ° °Education:  9 to 11 years  ° °Homebound arranged: No ° °Financial Resources:  Medicaid  ° °Other Resources:  Food Stamps  , WIC  ° °Cultural/Religious Considerations Which May Impact Care:   ° °Strengths:  Ability to meet basic needs  , Home prepared for child  , Pediatrician chosen  ° °Psychotropic Medications:        ° °Pediatrician:    [place] area ° °Pediatrician List:  ° °[facility] ([address])  °[place]    °[place]    °[place]    °[place]    °[place]    ° ° °Pediatrician Fax Number:   ° °Risk Factors/Current Problems:  Substance  Use    ° °Cognitive State:  Able to Concentrate  , Linear Thinking    ° °Mood/Affect:  Comfortable  , Calm    ° °CSW Assessment:  CSW received consult for "Does not live with other children". CSW also noted that MOB has limited PNC (2 visits). CSW met with MOB to offer support and complete assessment.   ° °CSW met with MOB at bedside and introduced CSW role. CSW observed MOB holding the infant and she introduced her support as her grandmother "Clarina." CSW offered MOB privacy. MOB welcomed CSW to complete the assessment with her grandmother present. MOB confirmed that she currently lives at the address listed. She reported that plans to move in the coming weeks but is not sure of her new address. MOB reported that she, her sons Jakarie and Ja'Cion and "Jakarie's father Theron Farrow live together. MOB provided an updated contact number [phone]. MOB reported that she is currently unemployed, and that FOB (Walter Woods) of the infant works for the City of . MOB reported she receives both WIC/FS.  ° °CSW inquired about MOB limited PNC (2 visit). MOB reported that she tried to make visits when she could include visit for the ultrasound appointments. CSW informed MOB that the ultrasound visit is not considered routine PNC   visits. MOB reported understanding. CSW informed MOB about the hospital drug screen policy. CSW inquired if MOB used substances during the pregnancy. MOB disclosed that she used marijuana throughout the pregnancy and the last time she used was about 3 weeks ago. MOB reported she used because of nausea symptoms during the pregnancy. MOB denied using any other substance during the pregnancy. MOB reported that she is familiar with the “CPS process” because of past CPS history. MOB reported that she had an open CPS case with her son Jakarie in 2021 for THC use and Ja'Cion in 2016 for THC use. MOB reported that both cases are closed. MOB reported that she has custody of her children. MOB made  aware that CSW will follow the infant UDS/CDS and make a report to CPS, if warranted. MOB reported understanding. MOB reported "it not a problem, I can stop on my own." MOB reported that she is not interested in SA resources at this time.  ° °CSW inquired how MOB has felt since giving birth. MOB reported feeling "okay." MOB reported the L&D went very quickly "it was crazy, when I got here the baby's head was coming out." MOB reported during the pregnancy she felt sad for most of the pregnancy. MOB reported she sees a counselor “Cassandra” at [facility], and she feels comfortable talking and reaching out to her. MOB reported her last session was in January. CSW educated MOB about [facility] and offered to make a referral. MOB reported that she is open to look into their services before deciding. CSW inquired about MOB coping strategies. MOB stated, “I get out the house, I go grocery shopping, it relaxes me.” CSW encouraged MOB efforts and gave her list of additional coping strategies to use. CSW inquired if MOB experienced PPD. MOB shared that she felt very low and depressed for two weeks after giving birth in 2021 and reported that she hallucinated, " I saw people walking in my room." CSW assessed further. MOB denied auditory hallucination of thoughts of harming herself or others. CSW inquired if MOB contacted a medical provider. MOB reported that she did not seek medical attention because it lasted for only two weeks. CSW encouraged MOB to reach out to a medication professional if concerns arise. MOB reported she feels comfortable reaching out to her counselor “Cassandra” if concerns arise. MOB denied that she is currently experiencing hallucinations. MOB denied thoughts of harm to self and others. CSW provided education regarding the baby blues period vs. perinatal mood disorders, discussed treatment and gave resources for mental health follow up if concerns arise.  CSW  recommended MOB complete a self-evaluation during the postpartum time period using the New Mom Checklist from Postpartum Progress and encouraged MOB to contact a medical professional if symptoms are noted at any time. MOB reported understanding.  ° ° °MOB reported that she had items for the infant including a bassinet where the infant will sleep. CSW provided review of Sudden Infant Death Syndrome (SIDS) precautions. MOB reported understanding. MOB has chosen [facility] for the infant's follow up care and reported she will have transportation to the appointment. CSW offered additional community resources. MOB declined.  ° °Infant's UDS was positive for THC. CSW made a report to [place] CPS.  ° °CSW identifies no further need for intervention and no barriers to discharge at this time.  ° ° °CSW Plan/Description:  Sudden Infant Death Syndrome (SIDS) Education, CSW Will Continue to   Monitor Umbilical Cord Tissue Drug Screen Results and Make Report if Warranted, Hospital Drug Screen Policy Information, Child Protective Service Report  , Perinatal Mood and Anxiety Disorder (PMADs) Education, No Further Intervention Required/No Barriers to Discharge  ° ° °Tobyn Osgood A Teon Hudnall, LCSW °01/09/2022, 12:19 PM °

## 2022-01-09 NOTE — Discharge Instructions (Signed)
NO SEX UNTIL YOUR POSTPARTUM VISIT

## 2022-01-10 MED ORDER — ACETAMINOPHEN 500 MG PO TABS: 1000.0000 mg | ORAL_TABLET | Freq: Three times a day (TID) | ORAL | 0 refills | Status: DC | PRN

## 2022-01-10 NOTE — Discharge Summary (Signed)
Postpartum Discharge Summary     Patient Name: Erica Todd DOB: 03/15/1996 MRN: [id number]  Date of admission: 01/08/2022 Delivery date:01/08/2022  Delivering provider: WHITE, Ubaldo Glassing D  Date of discharge: 01/10/2022  Admitting diagnosis: Labor and delivery indication for care or intervention [O75.9] Intrauterine pregnancy: [redacted]w[redacted]d    Secondary diagnosis:  Principal Problem:   Vaginal delivery Active Problems:   Tobacco smoking affecting pregnancy in third trimester   History of FGR in prior pregnancy   History of herpes genitalis   Supervision of high risk pregnancy, antepartum   Short interval between pregnancies complicating pregnancy, antepartum   History of postpartum hypertension   Echogenic bowel of fetus on prenatal ultrasound  Additional problems: None    Discharge diagnosis: Term Pregnancy Delivered                                              Post partum procedures: None Augmentation: None Complications: None  Hospital course: Onset of Labor With Vaginal Delivery      26y.o. yo [id number] 25w5das admitted in Active Labor on 01/08/2022. Patient had an uncomplicated labor course as follows:  Membrane Rupture Time/Date: 5:59 AM ,01/08/2022   Delivery Method:Vaginal, Spontaneous  Episiotomy: None  Lacerations:  1st degree  Patient had an uncomplicated postpartum course.  She is ambulating, tolerating a regular diet, passing flatus, and urinating well.  Patient is discharged home in stable condition on 01/10/22.  Newborn Data: Birth date:01/08/2022  Birth time:6:10 AM  Gender:Female  Living status:Living  Apgars:9 ,9  Weight:2670 g   Magnesium Sulfate received: No BMZ received: No Rhophylac: N/A MMR: N/A T-DaP: Offered postpartum Flu: Offered postpartum  Transfusion: No  Physical exam  Vitals:   01/09/22 0540 01/09/22 2130 01/09/22 2225 01/10/22 0511  BP: 117/73 131/85 131/82 121/70  Pulse: 66 61 (!) 58 (!) 57  Resp: _0 Temp:  98.4 F (36.9 C)   98 F (36.7 C)  TempSrc:  Oral  Oral  SpO2:  100%  100%  Weight:      Height:       General: alert, cooperative, and no distress Lochia: appropriate Uterine Fundus: firm and below umbilicus  DVT Evaluation: no LE edema or calf tenderness to palpation  Labs: Lab Results  Component Value Date   WBC 9.0 01/08/2022   HGB 13.2 01/08/2022   HCT 38.0 01/08/2022   MCV 90.7 01/08/2022   PLT 229 01/08/2022   CMP Latest Ref Rng & Units 09/06/2020  Glucose 65 - 99 mg/dL 71  BUN 6 - 20 mg/dL 9  Creatinine 0.57 - 1.00 mg/dL 0.84  Sodium 134 - 144 mmol/L 139  Potassium 3.5 - 5.2 mmol/L 3.9  Chloride 96 - 106 mmol/L 102  CO2 20 - 29 mmol/L 23  Calcium 8.7 - 10.2 mg/dL 9.5  Total Protein 6.0 - 8.5 g/dL 7.4  Total Bilirubin 0.0 - 1.2 mg/dL 0.3  Alkaline Phos 44 - 121 IU/L 76  AST 0 - 40 IU/L 13  ALT 0 - 32 IU/L 9   Edinburgh Score: Edinburgh Postnatal Depression Scale Screening Tool 01/08/2022  I have been able to laugh and see the funny side of things. 0  I have looked forward with enjoyment to things. 0  I have blamed myself unnecessarily when things went wrong. 1  I have been  anxious or worried for no good reason. 0  I have felt scared or panicky for no good reason. 1  Things have been getting on top of me. 1  I have been so unhappy that I have had difficulty sleeping. 0  I have felt sad or miserable. 0  I have been so unhappy that I have been crying. 0  The thought of harming myself has occurred to me. 0  Edinburgh Postnatal Depression Scale Total 3     After visit meds:  Allergies as of 01/10/2022   No Known Allergies      Medication List     STOP taking these medications    aspirin EC 81 MG tablet       TAKE these medications    acetaminophen 500 MG tablet Commonly known as: TYLENOL Take 2 tablets (1,000 mg total) by mouth every 8 (eight) hours as needed (pain). What changed:  medication strength how much to take when to take this reasons to take this    ibuprofen 600 MG tablet Commonly known as: ADVIL Take 1 tablet (600 mg total) by mouth every 6 (six) hours as needed for mild pain, moderate pain or cramping. What changed: reasons to take this   prenatal multivitamin Tabs tablet Take 1 tablet by mouth daily at 12 noon. [place]         Discharge home in stable condition Infant Feeding: Bottle Infant Disposition: home with mother   Discharge instruction: per After Visit Summary and Postpartum booklet. Activity: Advance as tolerated. Pelvic rest for 6 weeks.  Diet: routine diet Future Appointments: Future Appointments  Date Time Provider [place]  02/05/2022 10:35 AM Darlina Rumpf, CNM [facility]   Follow up Visit:  [place] for [place] at [facility]. Call.   Specialty: Obstetrics and Gynecology Why: 4-6 weeks for your postpartum visit Contact information: [place] [address] [phone]               Please schedule this patient for a Virtual postpartum visit in 4 weeks with the following provider: Any provider. Additional Postpartum F/U: None  High risk pregnancy complicated by: Limited prenatal care, hx of FGR, short interval pregnancy, hx of postpartum HTN, fetal polydactyly  Delivery mode:  Vaginal, Spontaneous  Anticipated Birth Control:   Declines  01/10/2022 Genia Del, MD

## 2022-01-15 ENCOUNTER — Ambulatory Visit (HOSPITAL_COMMUNITY): Admission: EM | Admit: 2022-01-15 | Discharge: 2022-01-15 | Payer: Medicaid Other

## 2022-01-15 ENCOUNTER — Encounter (HOSPITAL_COMMUNITY): Payer: Self-pay | Admitting: Obstetrics & Gynecology

## 2022-01-15 ENCOUNTER — Other Ambulatory Visit: Payer: Self-pay

## 2022-01-15 ENCOUNTER — Inpatient Hospital Stay (HOSPITAL_COMMUNITY)
Admission: AD | Admit: 2022-01-15 | Discharge: 2022-01-15 | Disposition: A | Discharge status: Home / Self Care | Payer: Medicaid Other | Attending: Obstetrics & Gynecology | Admitting: Obstetrics & Gynecology

## 2022-01-15 ENCOUNTER — Encounter (HOSPITAL_COMMUNITY): Payer: Self-pay

## 2022-01-15 DIAGNOSIS — R1084 Generalized abdominal pain: Secondary | ICD-10-CM | POA: Insufficient documentation

## 2022-01-15 DIAGNOSIS — R102 Pelvic and perineal pain: Secondary | ICD-10-CM | POA: Diagnosis not present

## 2022-01-15 DIAGNOSIS — R309 Painful micturition, unspecified: Secondary | ICD-10-CM

## 2022-01-15 DIAGNOSIS — O9963 Diseases of the digestive system complicating the puerperium: Secondary | ICD-10-CM | POA: Diagnosis not present

## 2022-01-15 DIAGNOSIS — R198 Other specified symptoms and signs involving the digestive system and abdomen: Secondary | ICD-10-CM

## 2022-01-15 DIAGNOSIS — N938 Other specified abnormal uterine and vaginal bleeding: Secondary | ICD-10-CM | POA: Diagnosis not present

## 2022-01-15 DIAGNOSIS — R030 Elevated blood-pressure reading, without diagnosis of hypertension: Secondary | ICD-10-CM | POA: Diagnosis not present

## 2022-01-15 DIAGNOSIS — O99893 Other specified diseases and conditions complicating puerperium: Secondary | ICD-10-CM | POA: Insufficient documentation

## 2022-01-15 DIAGNOSIS — R109 Unspecified abdominal pain: Secondary | ICD-10-CM | POA: Diagnosis present

## 2022-01-15 MED ORDER — NIFEDIPINE ER 30 MG PO TB24: 30.0000 mg | ORAL_TABLET | Freq: Every day | ORAL | 1 refills | Status: DC

## 2022-01-15 MED ORDER — NIFEDIPINE ER OSMOTIC RELEASE 30 MG PO TB24
30.0000 mg | ORAL_TABLET | Freq: Every day | ORAL | Status: DC
  Administered 2022-01-15: 30 mg via ORAL
  Filled 2022-01-15: qty 1

## 2022-01-15 MED ORDER — ACETAMINOPHEN 500 MG PO TABS
1000.0000 mg | ORAL_TABLET | Freq: Once | ORAL | Status: AC
  Administered 2022-01-15: 1000 mg via ORAL
  Filled 2022-01-15: qty 2

## 2022-01-15 MED ORDER — IBUPROFEN 800 MG PO TABS
800.0000 mg | ORAL_TABLET | Freq: Once | ORAL | Status: AC
  Administered 2022-01-15: 800 mg via ORAL
  Filled 2022-01-15: qty 1

## 2022-01-15 NOTE — ED Provider Notes (Signed)
[employer]    CSN: [id number] Arrival date & time: 01/15/22  0820      History   Chief Complaint Chief Complaint  Patient presents with   Abdominal Pain    HPI Erica Todd is a 26 y.o. female.   26 year old female presents with lower abdominal and pelvic pain that started 2 to 3 days ago. She is 1 week post-partum after giving birth vaginally to a baby girl at term. She did not have any complications during labor or after delivery but started having more severe pain a few days ago. Continues to bleed vaginally. Also having increased pain abdominally when urinating and having a bowel movement. Denies any vomiting or diarrhea. No distinct fever. Has taken Tylenol and Advil with minimal relief. No previous issues with previous pregnancies or deliveries except history of elevated blood pressure during pregnancy. No other chronic health issues.   The history is provided by the patient.   Past Medical History:  Diagnosis Date   Headache(784.0)     Patient Active Problem List   Diagnosis Date Noted   Vaginal delivery 01/08/2022   Echogenic bowel of fetus on prenatal ultrasound 09/17/2021   Supervision of high risk pregnancy, antepartum 07/25/2021   Short interval between pregnancies complicating pregnancy, antepartum 07/25/2021   History of postpartum hypertension 07/25/2021   Tobacco smoking affecting pregnancy in third trimester 07/28/2020   History of FGR in prior pregnancy 07/28/2020   History of herpes genitalis 07/28/2020    Past Surgical History:  Procedure Laterality Date   NO PAST SURGERIES      OB History     Gravida  5   Para  3   Term  3   Preterm  0   AB  2   Living  3      SAB  1   IAB  1   Ectopic  0   Multiple  0   Live Births  3            Home Medications    Prior to Admission medications   Medication Sig Start Date End Date Taking? Authorizing Provider  acetaminophen (TYLENOL) 500 MG tablet Take 2 tablets  (1,000 mg total) by mouth every 8 (eight) hours as needed (pain). 01/10/22   Genia Del, MD  ibuprofen (ADVIL) 600 MG tablet Take 1 tablet (600 mg total) by mouth every 6 (six) hours as needed for mild pain, moderate pain or cramping. 01/09/22   Roma Schanz, CNM  Prenatal Vit-Fe Fumarate-FA (PRENATAL MULTIVITAMIN) TABS tablet Take 1 tablet by mouth daily at 12 noon. GUMMIES    [provider]    Family History Family History  Problem Relation Age of Onset   Alcohol abuse Neg Hx    Arthritis Neg Hx    Asthma Neg Hx    Birth defects Neg Hx    Cancer Neg Hx    COPD Neg Hx    Depression Neg Hx    Diabetes Neg Hx    Drug abuse Neg Hx    Early death Neg Hx    Hearing loss Neg Hx    Heart disease Neg Hx    Hyperlipidemia Neg Hx    Hypertension Neg Hx    Kidney disease Neg Hx    Learning disabilities Neg Hx    Mental illness Neg Hx    Mental retardation Neg Hx    Miscarriages / Stillbirths Neg Hx    Stroke Neg Hx  Vision loss Neg Hx    Varicose Veins Neg Hx     Social History Social History   Tobacco Use   Smoking status: Never   Smokeless tobacco: Never  Vaping Use   Vaping Use: Never used  Substance Use Topics   Alcohol use: Not Currently   Drug use: Not Currently    Frequency: 2.0 times per week    Types: Marijuana    Comment: last use 01 Oct 2021     Allergies   Patient has no known allergies.   Review of Systems Review of Systems  Constitutional:  Positive for fatigue. Negative for chills and fever.  Gastrointestinal:  Positive for abdominal pain. Negative for constipation, diarrhea and vomiting.  Genitourinary:  Positive for dysuria, pelvic pain and vaginal bleeding (post partum). Negative for urgency.  Skin:  Negative for color change and rash.  Allergic/Immunologic: Negative for environmental allergies, food allergies and immunocompromised state.  Neurological:  Negative for dizziness, seizures and headaches.  Hematological:   Negative for adenopathy. Does not bruise/bleed easily.    Physical Exam Triage Vital Signs ED Triage Vitals  Enc Vitals Group     BP 01/15/22 0832 (!) 152/72     Pulse Rate 01/15/22 0832 73     Resp 01/15/22 0832 17     Temp 01/15/22 0832 97.9 F (36.6 C)     Temp Source 01/15/22 0832 Oral     SpO2 01/15/22 0832 98 %     Weight --      Height --      Head Circumference --      Peak Flow --      Pain Score 01/15/22 0833 9     Pain Loc --      Pain Edu? --      Excl. in [place]? --    No data found.  Updated Vital Signs BP (!) 152/72 (BP Location: Right Arm)    Pulse 73    Temp 97.9 F (36.6 C) (Oral)    Resp 17    SpO2 98%   Visual Acuity Right Eye Distance:   Left Eye Distance:   Bilateral Distance:    Right Eye Near:   Left Eye Near:    Bilateral Near:     Physical Exam Vitals and nursing note reviewed.  Constitutional:      General: She is awake. She is not in acute distress.    Appearance: She is well-developed and well-groomed.     Comments: She is sitting in an exam chair, nursing her 58 week old girl with a bottle. She is in no acute distress but any movement or sitting in certain positions increases pain.   HENT:     Head: Normocephalic and atraumatic.     Right Ear: Hearing normal.     Left Ear: Hearing normal.  Cardiovascular:     Rate and Rhythm: Normal rate.  Pulmonary:     Effort: Pulmonary effort is normal.  Abdominal:     Comments: Did not perform abdominal or pelvic exam at this time.   Skin:    General: Skin is warm.  Neurological:     General: No focal deficit present.     Mental Status: She is alert and oriented to person, place, and time.  Psychiatric:        Mood and Affect: Mood normal.        Behavior: Behavior normal. Behavior is cooperative.        Thought Content: Thought  content normal.        Judgment: Judgment normal.     UC Treatments / Results  Labs (all labs ordered are listed, but only abnormal results are displayed) Labs  Reviewed - No data to display  EKG   Radiology No results found.  Procedures Procedures (including critical care time)  Medications Ordered in UC Medications - No data to display  Initial Impression / Assessment and Plan / UC Course  I have reviewed the triage vital signs and the nursing notes.  Pertinent labs & imaging results that were available during my care of the patient were reviewed by me and considered in my medical decision making (see chart for details).     Since you are only 1 week post partum with new onset of lower pelvic/abdominal pain and continued vaginal bleeding, recommend go to [facility] ER at [facility] now for further evaluation. May need imaging and additional lab work up. She is stable and may go over by personal vehicle. Patient understands and agrees with plan. She has not contacted her OB/GYN yet about symptoms. She will go now to [facility] ER.  Final Clinical Impressions(s) / UC Diagnoses   Final diagnoses:  Pelvic pain in female  Moderate vaginal bleeding  Pain with urination  Pain with bowel movements     Discharge Instructions      Since you are 1 week post partum after vaginal delivery of a baby girl and are experiencing significant lower abdominal pain that is getting worse, recommend go to [facility] ER for further evaluation.     ED Prescriptions   None    PDMP not reviewed this encounter.   Katy Apo, NP 01/15/22 [phone]

## 2022-01-15 NOTE — MAU Note (Signed)
Erica Todd is a 26 y.o. here in MAU reporting: s/p SVD on 2/7. For the past 2-3 days has had increased abdominal pain. Has been taking ibuprofen, states the medication does work but when it wears off the pain will start back again. No increased bleeding.  Onset of complaint: ongoing  Pain score: 0/10  Vitals:   01/15/22 0926  BP: 140/85  Pulse: 62  Resp: 16  Temp: 98.5 F (36.9 C)  SpO2: 100%     Lab orders placed from triage: none

## 2022-01-15 NOTE — ED Notes (Signed)
Patient is being discharged from the Urgent Care and sent to the [facility] via POV . Per Dewayne Hatch NP, patient is in need of higher level of care due to abdominal pain. Patient is aware and verbalizes understanding of plan of care.  Vitals:   01/15/22 0832  BP: (!) 152/72  Pulse: 73  Resp: 17  Temp: 97.9 F (36.6 C)  SpO2: 98%

## 2022-01-15 NOTE — MAU Provider Note (Signed)
History     CSN: [id number]  Arrival date and time: 01/15/22 0909   Event Date/Time   First Provider Initiated Contact with Patient 01/15/22 0932      Chief Complaint  Patient presents with   Abdominal Pain   HPI Erica Todd is a 26 y.o. [id number] 7 days postpartum from a vaginal delivery who presents with abdominal pain. She reports it is all across her lower abdomen and she rates it a 6/10. She tried ibuprofen with some relief. She reports minimal lochia and no odor. She denies any fever at home.  She was found to have elevated blood pressures on arrival to MAU. She denies any headache, visual changes or epigastric pain.  OB History     Gravida  5   Para  3   Term  3   Preterm  0   AB  2   Living  3      SAB  1   IAB  1   Ectopic  0   Multiple  0   Live Births  3           Past Medical History:  Diagnosis Date   Headache(784.0)     Past Surgical History:  Procedure Laterality Date   NO PAST SURGERIES      Family History  Problem Relation Age of Onset   Alcohol abuse Neg Hx    Arthritis Neg Hx    Asthma Neg Hx    Birth defects Neg Hx    Cancer Neg Hx    COPD Neg Hx    Depression Neg Hx    Diabetes Neg Hx    Drug abuse Neg Hx    Early death Neg Hx    Hearing loss Neg Hx    Heart disease Neg Hx    Hyperlipidemia Neg Hx    Hypertension Neg Hx    Kidney disease Neg Hx    Learning disabilities Neg Hx    Mental illness Neg Hx    Mental retardation Neg Hx    Miscarriages / Stillbirths Neg Hx    Stroke Neg Hx    Vision loss Neg Hx    Varicose Veins Neg Hx     Social History   Tobacco Use   Smoking status: Never   Smokeless tobacco: Never  Vaping Use   Vaping Use: Never used  Substance Use Topics   Alcohol use: Not Currently   Drug use: Not Currently    Frequency: 2.0 times per week    Types: Marijuana    Comment: last use 01 Oct 2021    Allergies: No Known Allergies  Medications Prior to Admission  Medication Sig  Dispense Refill Last Dose   acetaminophen (TYLENOL) 500 MG tablet Take 2 tablets (1,000 mg total) by mouth every 8 (eight) hours as needed (pain). 60 tablet 0    ibuprofen (ADVIL) 600 MG tablet Take 1 tablet (600 mg total) by mouth every 6 (six) hours as needed for mild pain, moderate pain or cramping. 30 tablet 0    Prenatal Vit-Fe Fumarate-FA (PRENATAL MULTIVITAMIN) TABS tablet Take 1 tablet by mouth daily at 12 noon. GUMMIES       Review of Systems  Constitutional: Negative.  Negative for fatigue and fever.  HENT: Negative.    Respiratory: Negative.  Negative for shortness of breath.   Cardiovascular: Negative.  Negative for chest pain.  Gastrointestinal:  Positive for abdominal pain. Negative for constipation, diarrhea, nausea and vomiting.  Genitourinary: Negative.  Negative for dysuria, vaginal bleeding and vaginal discharge.  Neurological: Negative.  Negative for dizziness and headaches.  Physical Exam   Blood pressure 140/85, pulse 62, temperature 98.5 F (36.9 C), temperature source Oral, resp. rate 16, height 5\' 5"  (1.651 m), weight 68.3 kg, SpO2 100 %, not currently breastfeeding.  Patient Vitals for the past 24 hrs:  BP Temp Temp src Pulse Resp SpO2 Height Weight  01/15/22 1101 (!) 143/80 -- -- (!) 51 -- -- -- --  01/15/22 1045 (!) 144/77 -- -- (!) 50 -- -- -- --  01/15/22 1030 (!) 157/84 -- -- (!) 48 -- -- -- --  01/15/22 1025 137/77 -- -- (!) 58 -- -- -- --  01/15/22 0946 (!) 154/89 -- -- 76 -- -- -- --  01/15/22 0945 (!) 154/89 -- -- 76 -- 100 % -- --  01/15/22 0931 (!) 156/81 -- -- 67 -- -- -- --  01/15/22 0926 140/85 98.5 F (36.9 C) Oral 62 16 100 % -- --  01/15/22 0917 -- -- -- -- -- -- 5\' 5"  (1.651 m) 68.3 kg   Physical Exam Vitals and nursing note reviewed.  Constitutional:      General: She is not in acute distress.    Appearance: She is well-developed.  HENT:     Head: Normocephalic.  Eyes:     Pupils: Pupils are equal, round, and reactive to light.   Cardiovascular:     Rate and Rhythm: Normal rate and regular rhythm.     Heart sounds: Normal heart sounds.  Pulmonary:     Effort: Pulmonary effort is normal. No respiratory distress.     Breath sounds: Normal breath sounds.  Abdominal:     General: Bowel sounds are normal. There is no distension.     Palpations: Abdomen is soft.     Tenderness: There is abdominal tenderness in the suprapubic area.  Skin:    General: Skin is warm and dry.  Neurological:     Mental Status: She is alert and oriented to person, place, and time.  Psychiatric:        Mood and Affect: Mood normal.        Behavior: Behavior normal.        Thought Content: Thought content normal.        Judgment: Judgment normal.    MAU Course  Procedures  MDM UA CBC, CMP  Patient refused blood work. Requested CNM to presumptively treat for whatever we thought it was. Discussed that CNM cannot rule out preeclampsia or endometritis without blood work. Patient verbalized understanding and declined.   Patient declined to give urine sample  Tylenol  Ibuprofen PO Procardia  Patient reports no pain  Assessment and Plan   1. Postpartum state   2. Generalized abdominal pain    -Discharge home in stable condition -Rx for procardia sent to patient's pharmacy -Preeclampsia precautions discussed -Patient advised to follow-up with [facility] this week for a BP check, message sent -Patient may return to MAU as needed or if her condition were to change or worsen   CNM 01/15/2022, 9:32 AM

## 2022-01-15 NOTE — Discharge Instructions (Addendum)
Since you are 1 week post partum after vaginal delivery of a baby girl and are experiencing significant lower abdominal pain that is getting worse, recommend go to [facility] ER for further evaluation.

## 2022-01-15 NOTE — ED Triage Notes (Signed)
Pt presents one week postpartum, with 3 days of severe generalized abdominal pain.

## 2022-01-22 ENCOUNTER — Ambulatory Visit (INDEPENDENT_AMBULATORY_CARE_PROVIDER_SITE_OTHER): Payer: Medicaid Other

## 2022-01-22 ENCOUNTER — Other Ambulatory Visit: Payer: Self-pay

## 2022-01-22 VITALS — BP 128/77 | HR 65 | Wt 148.6 lb

## 2022-01-22 DIAGNOSIS — Z0130 Encounter for examination of blood pressure without abnormal findings: Secondary | ICD-10-CM

## 2022-01-22 NOTE — Progress Notes (Signed)
Blood Pressure Check Visit  Erica Todd is here for blood pressure check following spontaneous vaginal delivery on 01/08/22. BP today is 128/77. Patient denies any dizziness, blurred vision, headache, shortness of breath, peripheral edema. Patient is currently taking Nifedipine 30 mg daily. I reviewed signs and symptoms of pre-eclampsia with patient. I reviewed patient's postpartum appointment date and time with her. Patient denies any other concerns or questions.   Cline Crock, RN 01/22/2022

## 2022-02-05 ENCOUNTER — Telehealth (INDEPENDENT_AMBULATORY_CARE_PROVIDER_SITE_OTHER): Payer: Medicaid Other | Admitting: Advanced Practice Midwife

## 2022-02-05 ENCOUNTER — Encounter: Payer: Self-pay | Admitting: Advanced Practice Midwife

## 2022-02-05 DIAGNOSIS — Z8679 Personal history of other diseases of the circulatory system: Secondary | ICD-10-CM | POA: Diagnosis not present

## 2022-02-05 DIAGNOSIS — Z8759 Personal history of other complications of pregnancy, childbirth and the puerperium: Secondary | ICD-10-CM | POA: Diagnosis not present

## 2022-02-05 DIAGNOSIS — O0990 Supervision of high risk pregnancy, unspecified, unspecified trimester: Secondary | ICD-10-CM

## 2022-02-05 NOTE — Progress Notes (Signed)
? ? ?Post Partum Visit Note ? ?Erica Todd is a 26 y.o. [phone] female who presents for a postpartum visit. She is 4 weeks postpartum following a normal spontaneous vaginal delivery.  I have fully reviewed the prenatal and intrapartum course. The delivery was at 37.5 gestational weeks.  Anesthesia: epidural. Postpartum course has been c/b postpartum hypertension. This was diagnosed during an MAU visit 01/15/2022. Patient was prescribed Procardia and is compliant but endorses daily headaches due to the medication. She would like to discontinue use of this medication if possible. ? ?Erica Todd is doing well. Baby is feeding by bottle - Similac Neosure. Bleeding staining only. Bowel function is normal. Bladder function is normal. Patient is not sexually active. Contraception method is none. Postpartum depression screening: negative. ? ? ?The pregnancy intention screening data noted above was reviewed. Patient declines discussion of contraception during today's visit. ? ? ?Health Maintenance Due  ?Topic Date Due  ? HPV VACCINES (1 - 2-dose series) Never done  ? Hepatitis C Screening  Never done  ? TETANUS/TDAP  Never done  ? INFLUENZA VACCINE  Never done  ? ? ?The following portions of the patient's history were reviewed and updated as appropriate: allergies, current medications, past family history, past medical history, past social history, past surgical history, and problem list. ? ?Review of Systems ?A comprehensive review of systems was negative. ? ?Objective:  ?There were no vitals taken for this visit.  ? ?General:  alert, cooperative, appears stated age, and no distress  ? Breasts:  Not indicated  ?Lungs: clear to auscultation bilaterally  ?Heart:  regular rate and rhythm, S1, S2 normal, no murmur, click, rub or gallop  ?Abdomen: soft, non-tender; bowel sounds normal; no masses,  no organomegaly   ?Wound N/A  ?GU exam:  not indicated  ?     ?Assessment:  ? ?Normal postpartum physical exam ?MAU notes from 02/14  encounter reviewed. Patient declined bloodwork at that time. Initially agreeable to CBC and CMET in office today but declined when phlebotomist entered room to collect ?Per patient request will trial blood pressure without Procardia. Patient to take blood pressure on home cuff no later than Friday and send via MyChart ?Patient to return to [facility] for RN BP check in one week to assess for ongoing BP medication ? ?Plan:  ? ?Essential components of care per ACOG recommendations: ? ?1.  Mood and well being: Patient with negative depression screening today. Reviewed local resources for support.  ?- Patient tobacco use? No.   ?- hx of drug use? No.   ? ?2. Infant care and feeding:  ?-Patient currently breastmilk feeding? Yes. Reviewed importance of draining breast regularly to support lactation.  ?-Social determinants of health (SDOH) reviewed in EPIC. No concerns ? ?3. Sexuality, contraception and birth spacing ?- Patient declines discussiom ? ?4. Sleep and fatigue ?-Encouraged family/partner/community support of 4 hrs of uninterrupted sleep to help with mood and fatigue ? ?5. Physical Recovery  ?- Discussed patients delivery and complications. She describes her labor as good. ?- Patient had a Vaginal, no problems at delivery. Patient had a 1st degree laceration. Perineal healing reviewed. Patient expressed understanding ?- Patient has urinary incontinence? No. ?- Patient is safe to resume physical and sexual activity ? ?6.  Health Maintenance ?- HM due items addressed Yes ?- Last pap smear 03/06/2020 at [facility], [facility]IL[facility]. Pap smear not done at today's visit.  ?-Breast Cancer screening indicated? No.  ? ?7. Chronic Disease/Pregnancy Condition follow up: Will reassess after blood pressure  check next week ? ?Erica Todd, MSA, MSN, CNM ?Certified Nurse Midwife, Faculty Practice ?Center for [employer], [facility] ? ?

## 2022-02-05 NOTE — Progress Notes (Signed)
Patient wants to know if she needs to continue taking her blood pressure rx ? ?Denies any lingering headaches or vision disturbances. Patient did state " the only time I get headaches is when I take the blood pressures meds"  ?

## 2022-02-12 ENCOUNTER — Ambulatory Visit (INDEPENDENT_AMBULATORY_CARE_PROVIDER_SITE_OTHER): Payer: Medicaid Other

## 2022-02-12 ENCOUNTER — Other Ambulatory Visit: Payer: Self-pay

## 2022-02-12 VITALS — BP 136/86 | HR 48 | Wt 155.8 lb

## 2022-02-12 DIAGNOSIS — Z0130 Encounter for examination of blood pressure without abnormal findings: Secondary | ICD-10-CM

## 2022-02-12 DIAGNOSIS — Z7689 Persons encountering health services in other specified circumstances: Secondary | ICD-10-CM

## 2022-02-12 NOTE — Progress Notes (Signed)
Blood Pressure Check Visit ? ?Erica Todd is here for blood pressure check following vaginal delivery on 01/08/22. Pt was diagnosed with postpartum hypertension and prescribed nifedipine 30 mg daily. Patient seen 02/05/22 for PP visit and nifedipine was discontinued. Patient was to return for BP check today. Pt denies any s/s of elevated BP. BP today is 136/86. Reviewed with Crissie Reese, MD who states patient does not need to restart medication and may follow up as needed.  ? ?Encouraged pt to establish with PCP. Pt desires referral to [facility]. Explained new patient appointments are a few months away at this office. Pt is agreeable. Pt also reports some concern about history of painful menstrual periods. Plans to use condoms for contraception. Has not had a period since delivery. Encouraged pt to schedule provider visit to discuss menstrual period if needed. ? ?Marjo Bicker, RN ?02/12/2022  10:43 AM  ? ?

## 2022-11-19 ENCOUNTER — Encounter (HOSPITAL_COMMUNITY): Payer: Self-pay | Admitting: *Deleted

## 2022-11-19 ENCOUNTER — Ambulatory Visit (HOSPITAL_COMMUNITY)
Admission: EM | Admit: 2022-11-19 | Discharge: 2022-11-19 | Disposition: A | Discharge status: Home / Self Care | Payer: Medicaid Other | Attending: Physician Assistant | Admitting: Physician Assistant

## 2022-11-19 DIAGNOSIS — Z1152 Encounter for screening for COVID-19: Secondary | ICD-10-CM | POA: Insufficient documentation

## 2022-11-19 DIAGNOSIS — L309 Dermatitis, unspecified: Secondary | ICD-10-CM

## 2022-11-19 DIAGNOSIS — Z20822 Contact with and (suspected) exposure to covid-19: Secondary | ICD-10-CM | POA: Diagnosis present

## 2022-11-19 LAB — SARS CORONAVIRUS 2 (TAT 6-24 HRS): SARS Coronavirus 2: NEGATIVE

## 2022-11-19 NOTE — ED Triage Notes (Signed)
No sx, exposure at work has to have neg test to return.

## 2022-11-19 NOTE — Discharge Instructions (Signed)
COVID test will be completed in 48 hours or less.  If you do not get a call from this office that indicates the test is negative.  Log onto MyChart to view the test results with the post in 48 hours.  Internal referral has been made to [facility] the dermatology office.  Their office should be calling you within the next 48 hours to arrange an appointment so that he can be evaluated for the skin rash on the upper back.  Advised to follow-up PCP or return to urgent care as needed.

## 2022-11-19 NOTE — ED Provider Notes (Signed)
[facility]    CSN: [id number] Arrival date & time: 11/19/22  4098      History   Chief Complaint Chief Complaint  Patient presents with   Covid Exposure    HPI Erica Todd is a 26 y.o. female.   26 year old female presents for COVID screening.  Patient indicates that she was at work and she was notified that she had been exposed to COVID.  She indicates this occurred 5 days ago.  She has been advised by her employer that she will not be able to return to work until she demonstrates a negative COVID test.  Patient indicates that she is doing well, she does not have any fever, chills, cough, or congestion. Patient indicates that she has had a rash on the upper part of her back that has been present with her most of her life.  She indicates there are dark areas on the skin of the upper back that is spread diffusely.  She relates the areas do not itch but they are irritated.  Patient request to have this evaluated by dermatologist to be determined if there is treatment that can be given to resolve the areas and have it more uniform appearing.     Past Medical History:  Diagnosis Date   Headache(784.0)     Patient Active Problem List   Diagnosis Date Noted   Vaginal delivery 01/08/2022   Echogenic bowel of fetus on prenatal ultrasound 09/17/2021   Supervision of high risk pregnancy, antepartum 07/25/2021   Short interval between pregnancies complicating pregnancy, antepartum 07/25/2021   History of postpartum hypertension 07/25/2021   Tobacco smoking affecting pregnancy in third trimester 07/28/2020   History of FGR in prior pregnancy 07/28/2020   History of herpes genitalis 07/28/2020    Past Surgical History:  Procedure Laterality Date   NO PAST SURGERIES      OB History     Gravida  5   Para  3   Term  3   Preterm  0   AB  2   Living  3      SAB  1   IAB  1   Ectopic  0   Multiple  0   Live Births  3            Home  Medications    Prior to Admission medications   Medication Sig Start Date End Date Taking? Authorizing Provider  acetaminophen (TYLENOL) 500 MG tablet Take 2 tablets (1,000 mg total) by mouth every 8 (eight) hours as needed (pain). Patient not taking: Reported on 01/22/2022 01/10/22   Worthy Rancher, MD  ibuprofen (ADVIL) 600 MG tablet Take 1 tablet (600 mg total) by mouth every 6 (six) hours as needed for mild pain, moderate pain or cramping. Patient not taking: Reported on 01/22/2022 01/09/22   Cheral Marker, CNM  NIFEdipine (ADALAT CC) 30 MG 24 hr tablet Take 1 tablet (30 mg total) by mouth daily. 01/16/22   Rolm Bookbinder, CNM  Prenatal Vit-Fe Fumarate-FA (PRENATAL MULTIVITAMIN) TABS tablet Take 1 tablet by mouth daily at 12 noon. GUMMIES    [provider]    Family History Family History  Problem Relation Age of Onset   Alcohol abuse Neg Hx    Arthritis Neg Hx    Asthma Neg Hx    Birth defects Neg Hx    Cancer Neg Hx    COPD Neg Hx    Depression Neg Hx    Diabetes  Neg Hx    Drug abuse Neg Hx    Early death Neg Hx    Hearing loss Neg Hx    Heart disease Neg Hx    Hyperlipidemia Neg Hx    Hypertension Neg Hx    Kidney disease Neg Hx    Learning disabilities Neg Hx    Mental illness Neg Hx    Mental retardation Neg Hx    Miscarriages / Stillbirths Neg Hx    Stroke Neg Hx    Vision loss Neg Hx    Varicose Veins Neg Hx     Social History Social History   Tobacco Use   Smoking status: Never   Smokeless tobacco: Never  Vaping Use   Vaping Use: Never used  Substance Use Topics   Alcohol use: Not Currently   Drug use: Not Currently    Frequency: 2.0 times per week    Types: Marijuana    Comment: last use 01 Oct 2021     Allergies   Patient has no known allergies.   Review of Systems Review of Systems   Physical Exam Triage Vital Signs ED Triage Vitals  Enc Vitals Group     BP 11/19/22 0854 130/62     Pulse Rate 11/19/22 0854 (!) 116      Resp 11/19/22 0854 18     Temp 11/19/22 0854 98.1 F (36.7 C)     Temp Source 11/19/22 0854 Oral     SpO2 11/19/22 0854 100 %     Weight --      Height --      Head Circumference --      Peak Flow --      Pain Score 11/19/22 0853 0     Pain Loc --      Pain Edu? --      Excl. in [place]? --    No data found.  Updated Vital Signs BP 130/62   Pulse (!) 116   Temp 98.1 F (36.7 C) (Oral)   Resp 18   LMP 10/06/2022 (Approximate)   SpO2 100%   Visual Acuity Right Eye Distance:   Left Eye Distance:   Bilateral Distance:    Right Eye Near:   Left Eye Near:    Bilateral Near:     Physical Exam Constitutional:      Appearance: Normal appearance.  HENT:     Right Ear: Tympanic membrane and ear canal normal.     Left Ear: Tympanic membrane and ear canal normal.     Mouth/Throat:     Mouth: Mucous membranes are moist.     Pharynx: Oropharynx is clear.  Cardiovascular:     Rate and Rhythm: Normal rate and regular rhythm.     Heart sounds: Normal heart sounds.  Pulmonary:     Effort: Pulmonary effort is normal.     Breath sounds: Normal breath sounds and air entry. No wheezing, rhonchi or rales.       Comments: Upper back: There is diffuse small papular hyperpigmented areas that are present diffusely scattered on the upper and middle part of the back.  These tend to have a more acne type formation present with the hyperpigmentation. Lymphadenopathy:     Cervical: No cervical adenopathy.  Neurological:     Mental Status: She is alert.      UC Treatments / Results  Labs (all labs ordered are listed, but only abnormal results are displayed) Labs Reviewed  SARS CORONAVIRUS 2 (TAT 6-24 HRS)  EKG   Radiology No results found.  Procedures Procedures (including critical care time)  Medications Ordered in UC Medications - No data to display  Initial Impression / Assessment and Plan / UC Course  I have reviewed the triage vital signs and the nursing  notes.  Pertinent labs & imaging results that were available during my care of the patient were reviewed by me and considered in my medical decision making (see chart for details).    Plan: 1.  The screening for COVID-19 will be treated with the following: A.  Treatment will be determined given the results of the COVID 19 test. 2.  Exposure to COVID-19 virus to be treated with the following: A.  Advised to observe and symptomatic treatment only if needed. 3.  The dermatitis of the upper back will be treated with the following: A.  Internal referral has been made to [facility] dermatology for evaluation of the skin condition. 4.  Patient advised follow-up PCP or return to urgent care if symptoms fail to improve. Final Clinical Impressions(s) / UC Diagnoses   Final diagnoses:  Encounter for screening for COVID-19  Exposure to COVID-19 virus  Dermatitis     Discharge Instructions      COVID test will be completed in 48 hours or less.  If you do not get a call from this office that indicates the test is negative.  Log onto MyChart to view the test results with the post in 48 hours.  Internal referral has been made to [facility] the dermatology office.  Their office should be calling you within the next 48 hours to arrange an appointment so that he can be evaluated for the skin rash on the upper back.  Advised to follow-up PCP or return to urgent care as needed.    ED Prescriptions   None    PDMP not reviewed this encounter.   Ellsworth Lennox, PA-C 11/19/22 [phone]

## 2023-02-03 ENCOUNTER — Ambulatory Visit (HOSPITAL_COMMUNITY)
Admission: EM | Admit: 2023-02-03 | Discharge: 2023-02-03 | Disposition: A | Discharge status: Home / Self Care | Payer: Medicaid Other | Attending: Physician Assistant | Admitting: Physician Assistant

## 2023-02-03 ENCOUNTER — Encounter (HOSPITAL_COMMUNITY): Payer: Self-pay

## 2023-02-03 DIAGNOSIS — Z20822 Contact with and (suspected) exposure to covid-19: Secondary | ICD-10-CM

## 2023-02-03 NOTE — ED Triage Notes (Signed)
Patient states she was exposed to Covid at her job and needs to be tested prior to returning to work. Patient denies any symptoms.

## 2023-02-03 NOTE — ED Provider Notes (Signed)
[place]    CSN: [id number] Arrival date & time: 02/03/23  1304      History   Chief Complaint Chief Complaint  Patient presents with   Covid Exposure    HPI Erica Todd is a 27 y.o. female.   Patient presents today requesting COVID testing.  Reports that there have been multiple people who have tested positive for place of employment.  She was last in the office approximately 5 days ago.  She has not had COVID in the past.  She has not had COVID-19 vaccinations.  Reports that her employer is requesting that everybody be tested prior to returning to work in order to manage the spread of the virus.  She denies any current symptoms including fever, cough, congestion, sore throat, nausea, vomiting, headache.  She denies any significant past medical history including allergies, asthma, COPD, smoking, diabetes.  She is confident that she is not pregnant.    Past Medical History:  Diagnosis Date   Headache(784.0)     Patient Active Problem List   Diagnosis Date Noted   Vaginal delivery 01/08/2022   Echogenic bowel of fetus on prenatal ultrasound 09/17/2021   Supervision of high risk pregnancy, antepartum 07/25/2021   Short interval between pregnancies complicating pregnancy, antepartum 07/25/2021   History of postpartum hypertension 07/25/2021   Tobacco smoking affecting pregnancy in third trimester 07/28/2020   History of FGR in prior pregnancy 07/28/2020   History of herpes genitalis 07/28/2020    Past Surgical History:  Procedure Laterality Date   NO PAST SURGERIES      OB History     Gravida  5   Para  3   Term  3   Preterm  0   AB  2   Living  3      SAB  1   IAB  1   Ectopic  0   Multiple  0   Live Births  3            Home Medications    Prior to Admission medications   Medication Sig Start Date End Date Taking? Authorizing Provider  acetaminophen (TYLENOL) 500 MG tablet Take 2 tablets (1,000 mg total) by mouth every 8  (eight) hours as needed (pain). Patient not taking: Reported on 01/22/2022 01/10/22   Genia Del, MD  ibuprofen (ADVIL) 600 MG tablet Take 1 tablet (600 mg total) by mouth every 6 (six) hours as needed for mild pain, moderate pain or cramping. Patient not taking: Reported on 01/22/2022 01/09/22   Roma Schanz, CNM    Family History Family History  Problem Relation Age of Onset   Diabetes Father    Alcohol abuse Neg Hx    Arthritis Neg Hx    Asthma Neg Hx    Birth defects Neg Hx    Cancer Neg Hx    COPD Neg Hx    Depression Neg Hx    Drug abuse Neg Hx    Early death Neg Hx    Hearing loss Neg Hx    Heart disease Neg Hx    Hyperlipidemia Neg Hx    Hypertension Neg Hx    Kidney disease Neg Hx    Learning disabilities Neg Hx    Mental illness Neg Hx    Mental retardation Neg Hx    Miscarriages / Stillbirths Neg Hx    Stroke Neg Hx    Vision loss Neg Hx    Varicose Veins Neg Hx  Social History Social History   Tobacco Use   Smoking status: Never   Smokeless tobacco: Never  Vaping Use   Vaping Use: Never used  Substance Use Topics   Alcohol use: Not Currently   Drug use: Not Currently    Frequency: 2.0 times per week    Types: Marijuana    Comment: last use 01 Oct 2021     Allergies   Patient has no known allergies.   Review of Systems Review of Systems  Constitutional:  Negative for activity change, appetite change, fatigue and fever.  HENT:  Negative for congestion, sinus pressure, sneezing and sore throat.   Respiratory:  Negative for cough and shortness of breath.   Cardiovascular:  Negative for chest pain.  Gastrointestinal:  Negative for abdominal pain, diarrhea, nausea and vomiting.  Neurological:  Negative for dizziness, light-headedness and headaches.     Physical Exam Triage Vital Signs ED Triage Vitals  Enc Vitals Group     BP 02/03/23 1349 107/61     Pulse Rate 02/03/23 1349 (!) 57     Resp 02/03/23 1349 14     Temp 02/03/23  1349 98.6 F (37 C)     Temp Source 02/03/23 1349 Oral     SpO2 02/03/23 1349 98 %     Weight --      Height --      Head Circumference --      Peak Flow --      Pain Score 02/03/23 1352 0     Pain Loc --      Pain Edu? --      Excl. in [place]? --    No data found.  Updated Vital Signs BP 107/61 (BP Location: Left Arm)   Pulse (!) 57   Temp 98.6 F (37 C) (Oral)   Resp 14   LMP 01/11/2023 (Approximate)   SpO2 98%   Visual Acuity Right Eye Distance:   Left Eye Distance:   Bilateral Distance:    Right Eye Near:   Left Eye Near:    Bilateral Near:     Physical Exam Vitals reviewed.  Constitutional:      General: She is awake. She is not in acute distress.    Appearance: Normal appearance. She is well-developed. She is not ill-appearing.     Comments: Very pleasant female appears stated age in no acute distress sitting comfortably in exam room  HENT:     Head: Normocephalic and atraumatic.     Right Ear: Tympanic membrane, ear canal and external ear normal. Tympanic membrane is not erythematous or bulging.     Left Ear: Tympanic membrane, ear canal and external ear normal. Tympanic membrane is not erythematous or bulging.     Nose:     Right Sinus: No maxillary sinus tenderness or frontal sinus tenderness.     Left Sinus: No maxillary sinus tenderness or frontal sinus tenderness.     Mouth/Throat:     Pharynx: Uvula midline. No oropharyngeal exudate or posterior oropharyngeal erythema.  Cardiovascular:     Rate and Rhythm: Normal rate and regular rhythm.     Heart sounds: Normal heart sounds, S1 normal and S2 normal. No murmur heard. Pulmonary:     Effort: Pulmonary effort is normal.     Breath sounds: Normal breath sounds. No wheezing, rhonchi or rales.     Comments: Clear to auscultation bilaterally Psychiatric:        Behavior: Behavior is cooperative.  UC Treatments / Results  Labs (all labs ordered are listed, but only abnormal results are  displayed) Labs Reviewed  SARS CORONAVIRUS 2 (TAT 6-24 HRS)    EKG   Radiology No results found.  Procedures Procedures (including critical care time)  Medications Ordered in UC Medications - No data to display  Initial Impression / Assessment and Plan / UC Course  I have reviewed the triage vital signs and the nursing notes.  Pertinent labs & imaging results that were available during my care of the patient were reviewed by me and considered in my medical decision making (see chart for details).     Patient is well-appearing, afebrile, nontoxic, nontachycardic.  COVID testing was obtained and is pending.  She is currently asymptomatic.  She is young and otherwise healthy so not a candidate for antivirals even if positive for COVID.  She was provided a work excuse note.  Discussed that she develops any symptoms she should return for reevaluation.  Final Clinical Impressions(s) / UC Diagnoses   Final diagnoses:  Exposure to confirmed case of COVID-19  Encounter for laboratory testing for COVID-19 virus     Discharge Instructions      Monitor your MyChart for your COVID results.  If you develop any symptoms please return for reevaluation.  We will contact you if you are positive for COVID.     ED Prescriptions   None    PDMP not reviewed this encounter.   Terrilee Croak, PA-C 02/03/23 1411

## 2023-02-03 NOTE — Discharge Instructions (Signed)
Monitor your MyChart for your COVID results.  If you develop any symptoms please return for reevaluation.  We will contact you if you are positive for COVID.

## 2023-02-04 LAB — SARS CORONAVIRUS 2 (TAT 6-24 HRS): SARS Coronavirus 2: NEGATIVE

## 2023-03-12 ENCOUNTER — Encounter (HOSPITAL_COMMUNITY): Payer: Self-pay

## 2023-03-12 ENCOUNTER — Ambulatory Visit (HOSPITAL_COMMUNITY)
Admission: EM | Admit: 2023-03-12 | Discharge: 2023-03-12 | Disposition: A | Discharge status: Home / Self Care | Payer: Medicaid Other | Attending: Emergency Medicine | Admitting: Emergency Medicine

## 2023-03-12 DIAGNOSIS — Z20822 Contact with and (suspected) exposure to covid-19: Secondary | ICD-10-CM | POA: Diagnosis present

## 2023-03-12 DIAGNOSIS — R001 Bradycardia, unspecified: Secondary | ICD-10-CM

## 2023-03-12 NOTE — Discharge Instructions (Addendum)
We have tested you for COVID-19 today in clinic and we will call if your results are positive.  Today on your physical exam your heart rate was lower than the typical heart rate.  It has been this way for your past few visits.  This is not concerning since you are not having symptoms.  If you develop any shortness of breath, chest pain or other concerning symptoms you can return to clinic or seek immediate care.  Please follow-up with your primary care provider to establish care, you can contact [facility] to get scheduled.

## 2023-03-12 NOTE — ED Provider Notes (Signed)
[facility]    CSN: [id number] Arrival date & time: 03/12/23  1741      History   Chief Complaint Chief Complaint  Patient presents with   Covid Exposure    HPI Erica Todd is a 27 y.o. female.   Patient presents to clinic for a known COVID exposure that occurred on Monday.  She denies any symptoms such as headache, fatigue, cough, shortness of breath, body aches or nasal congestion.  She would like COVID-19 testing prior to returning to work.    The history is provided by the patient and medical records.    Past Medical History:  Diagnosis Date   Headache(784.0)     Patient Active Problem List   Diagnosis Date Noted   Vaginal delivery 01/08/2022   Echogenic bowel of fetus on prenatal ultrasound 09/17/2021   Supervision of high risk pregnancy, antepartum 07/25/2021   Short interval between pregnancies complicating pregnancy, antepartum 07/25/2021   History of postpartum hypertension 07/25/2021   Tobacco smoking affecting pregnancy in third trimester 07/28/2020   History of FGR in prior pregnancy 07/28/2020   History of herpes genitalis 07/28/2020    Past Surgical History:  Procedure Laterality Date   NO PAST SURGERIES      OB History     Gravida  5   Para  3   Term  3   Preterm  0   AB  2   Living  3      SAB  1   IAB  1   Ectopic  0   Multiple  0   Live Births  3            Home Medications    Prior to Admission medications   Medication Sig Start Date End Date Taking? Authorizing Provider  acetaminophen (TYLENOL) 500 MG tablet Take 2 tablets (1,000 mg total) by mouth every 8 (eight) hours as needed (pain). Patient not taking: Reported on 01/22/2022 01/10/22   Worthy Rancher, MD  ibuprofen (ADVIL) 600 MG tablet Take 1 tablet (600 mg total) by mouth every 6 (six) hours as needed for mild pain, moderate pain or cramping. Patient not taking: Reported on 01/22/2022 01/09/22   Cheral Marker, CNM    Family  History Family History  Problem Relation Age of Onset   Diabetes Father    Alcohol abuse Neg Hx    Arthritis Neg Hx    Asthma Neg Hx    Birth defects Neg Hx    Cancer Neg Hx    COPD Neg Hx    Depression Neg Hx    Drug abuse Neg Hx    Early death Neg Hx    Hearing loss Neg Hx    Heart disease Neg Hx    Hyperlipidemia Neg Hx    Hypertension Neg Hx    Kidney disease Neg Hx    Learning disabilities Neg Hx    Mental illness Neg Hx    Mental retardation Neg Hx    Miscarriages / Stillbirths Neg Hx    Stroke Neg Hx    Vision loss Neg Hx    Varicose Veins Neg Hx     Social History Social History   Tobacco Use   Smoking status: Never   Smokeless tobacco: Never  Vaping Use   Vaping Use: Never used  Substance Use Topics   Alcohol use: Not Currently   Drug use: Not Currently    Frequency: 2.0 times per week    Types:  Marijuana    Comment: last use 01 Oct 2021     Allergies   Patient has no known allergies.   Review of Systems Review of Systems  Constitutional:  Negative for chills, fatigue and fever.  HENT:  Negative for sore throat.   Respiratory:  Negative for cough and shortness of breath.   Cardiovascular:  Negative for chest pain and palpitations.  Neurological:  Negative for headaches.     Physical Exam Triage Vital Signs ED Triage Vitals  Enc Vitals Group     BP 03/12/23 1852 119/76     Pulse Rate 03/12/23 1852 (!) 50     Resp 03/12/23 1852 18     Temp 03/12/23 1852 98.1 F (36.7 C)     Temp src --      SpO2 03/12/23 1852 98 %     Weight --      Height --      Head Circumference --      Peak Flow --      Pain Score 03/12/23 1851 0     Pain Loc --      Pain Edu? --      Excl. in [place]? --    No data found.  Updated Vital Signs BP 119/76   Pulse (!) 50   Temp 98.1 F (36.7 C)   Resp 18   LMP 03/03/2023   SpO2 98%   Breastfeeding No   Visual Acuity Right Eye Distance:   Left Eye Distance:   Bilateral Distance:    Right Eye Near:    Left Eye Near:    Bilateral Near:     Physical Exam Vitals and nursing note reviewed.  Constitutional:      General: She is not in acute distress.    Appearance: She is well-developed.  HENT:     Head: Normocephalic and atraumatic.     Right Ear: External ear normal.     Left Ear: External ear normal.     Mouth/Throat:     Mouth: Mucous membranes are moist.  Eyes:     General: No scleral icterus.       Right eye: No discharge.        Left eye: No discharge.     Conjunctiva/sclera: Conjunctivae normal.  Cardiovascular:     Rate and Rhythm: Normal rate and regular rhythm.     Heart sounds: Normal heart sounds. No murmur heard. Pulmonary:     Effort: Pulmonary effort is normal. No respiratory distress.     Breath sounds: Normal breath sounds.  Skin:    General: Skin is warm and dry.     Capillary Refill: Capillary refill takes less than 2 seconds.  Neurological:     Mental Status: She is alert and oriented to person, place, and time.  Psychiatric:        Mood and Affect: Mood normal.        Behavior: Behavior is cooperative.      UC Treatments / Results  Labs (all labs ordered are listed, but only abnormal results are displayed) Labs Reviewed  SARS CORONAVIRUS 2 (TAT 6-24 HRS)    EKG   Radiology No results found.  Procedures Procedures (including critical care time)  Medications Ordered in UC Medications - No data to display  Initial Impression / Assessment and Plan / UC Course  I have reviewed the triage vital signs and the nursing notes.  Pertinent labs & imaging results that were available during my care of  the patient were reviewed by me and considered in my medical decision making (see chart for details).  Vitals in triage reviewed, patient is hemodynamically stable.  Asymptomatic bradycardia on exam with similar in the past.  Requesting COVID-19 testing due to recent close exposure, no symptoms.  Testing obtained in clinic and we will reach out if  positive.  Advised to follow-up with a primary care provider to establish care, patient verbalized understanding.  Return and follow-up precautions discussed, no questions at this time.     Final Clinical Impressions(s) / UC Diagnoses   Final diagnoses:  Exposure to COVID-19 virus  Bradycardia     Discharge Instructions      We have tested you for COVID-19 today in clinic and we will call if your results are positive.  Today on your physical exam your heart rate was lower than the typical heart rate.  It has been this way for your past few visits.  This is not concerning since you are not having symptoms.  If you develop any shortness of breath, chest pain or other concerning symptoms you can return to clinic or seek immediate care.  Please follow-up with your primary care provider to establish care, you can contact [facility] to get scheduled.       ED Prescriptions   None    PDMP not reviewed this encounter.   Eluterio Seymour, [place], [place] 03/12/23 1911

## 2023-03-12 NOTE — ED Triage Notes (Signed)
Pt would like covid testing due to exposure at work. Denies symptoms.

## 2023-03-13 LAB — SARS CORONAVIRUS 2 (TAT 6-24 HRS): SARS Coronavirus 2: NEGATIVE

## 2023-08-09 ENCOUNTER — Encounter (HOSPITAL_COMMUNITY): Payer: Self-pay

## 2023-08-09 ENCOUNTER — Ambulatory Visit (HOSPITAL_COMMUNITY)
Admission: EM | Admit: 2023-08-09 | Discharge: 2023-08-09 | Disposition: A | Discharge status: Home / Self Care | Payer: Medicaid Other | Attending: Emergency Medicine | Admitting: Emergency Medicine

## 2023-08-09 DIAGNOSIS — Z1152 Encounter for screening for COVID-19: Secondary | ICD-10-CM | POA: Diagnosis present

## 2023-08-09 NOTE — ED Triage Notes (Signed)
Patient states she is starting a new job and need a covid test before starting.

## 2023-08-09 NOTE — ED Provider Notes (Signed)
[facility]    CSN: [id number] Arrival date & time: 08/09/23  1010      History   Chief Complaint Chief Complaint  Patient presents with   covid test    HPI Erica Todd is a 27 y.o. female.  Here for COVID test Required by her work before she starts a new job tomorrow She does not have any symptoms, no known exposures  Past Medical History:  Diagnosis Date   Headache(784.0)     Patient Active Problem List   Diagnosis Date Noted   Vaginal delivery 01/08/2022   Echogenic bowel of fetus on prenatal ultrasound 09/17/2021   Supervision of high risk pregnancy, antepartum 07/25/2021   Short interval between pregnancies complicating pregnancy, antepartum 07/25/2021   History of postpartum hypertension 07/25/2021   Tobacco smoking affecting pregnancy in third trimester 07/28/2020   History of FGR in prior pregnancy 07/28/2020   History of herpes genitalis 07/28/2020    Past Surgical History:  Procedure Laterality Date   NO PAST SURGERIES      OB History     Gravida  5   Para  3   Term  3   Preterm  0   AB  2   Living  3      SAB  1   IAB  1   Ectopic  0   Multiple  0   Live Births  3            Home Medications    Prior to Admission medications   Medication Sig Start Date End Date Taking? Authorizing Provider  acetaminophen (TYLENOL) 500 MG tablet Take 2 tablets (1,000 mg total) by mouth every 8 (eight) hours as needed (pain). Patient not taking: Reported on 01/22/2022 01/10/22   Worthy Rancher, MD  ibuprofen (ADVIL) 600 MG tablet Take 1 tablet (600 mg total) by mouth every 6 (six) hours as needed for mild pain, moderate pain or cramping. Patient not taking: Reported on 01/22/2022 01/09/22   Cheral Marker, CNM    Family History Family History  Problem Relation Age of Onset   Diabetes Father    Alcohol abuse Neg Hx    Arthritis Neg Hx    Asthma Neg Hx    Birth defects Neg Hx    Cancer Neg Hx    COPD Neg Hx     Depression Neg Hx    Drug abuse Neg Hx    Early death Neg Hx    Hearing loss Neg Hx    Heart disease Neg Hx    Hyperlipidemia Neg Hx    Hypertension Neg Hx    Kidney disease Neg Hx    Learning disabilities Neg Hx    Mental illness Neg Hx    Mental retardation Neg Hx    Miscarriages / Stillbirths Neg Hx    Stroke Neg Hx    Vision loss Neg Hx    Varicose Veins Neg Hx     Social History Social History   Tobacco Use   Smoking status: Never   Smokeless tobacco: Never  Vaping Use   Vaping status: Never Used  Substance Use Topics   Alcohol use: Not Currently   Drug use: Not Currently    Frequency: 2.0 times per week    Types: Marijuana    Comment: last use 01 Oct 2021     Allergies   Patient has no known allergies.   Review of Systems Review of Systems Negative as per HPI  Physical Exam Triage Vital Signs ED Triage Vitals  Encounter Vitals Group     BP 08/09/23 1128 112/70     Systolic BP Percentile --      Diastolic BP Percentile --      Pulse Rate 08/09/23 1128 (!) 50     Resp 08/09/23 1128 16     Temp 08/09/23 1128 98.5 F (36.9 C)     Temp Source 08/09/23 1128 Oral     SpO2 08/09/23 1128 98 %     Weight 08/09/23 1131 150 lb (68 kg)     Height 08/09/23 1131 5\' 5"  (1.651 m)     Head Circumference --      Peak Flow --      Pain Score 08/09/23 1130 0     Pain Loc --      Pain Education --      Exclude from Growth Chart --    No data found.  Updated Vital Signs BP 112/70 (BP Location: Left Arm)   Pulse (!) 50   Temp 98.5 F (36.9 C) (Oral)   Resp 16   Ht 5\' 5"  (1.651 m)   Wt 150 lb (68 kg)   LMP 07/30/2023   SpO2 98%   BMI 24.96 kg/m   Visual Acuity Right Eye Distance:   Left Eye Distance:   Bilateral Distance:    Right Eye Near:   Left Eye Near:    Bilateral Near:     Physical Exam Vitals and nursing note reviewed.  Constitutional:      General: She is not in acute distress.    Appearance: Normal appearance.  Cardiovascular:      Rate and Rhythm: Normal rate and regular rhythm.  Pulmonary:     Effort: Pulmonary effort is normal.  Neurological:     Mental Status: She is alert and oriented to person, place, and time.      UC Treatments / Results  Labs (all labs ordered are listed, but only abnormal results are displayed) Labs Reviewed  SARS CORONAVIRUS 2 (TAT 6-24 HRS)    EKG   Radiology No results found.  Procedures Procedures (including critical care time)  Medications Ordered in UC Medications - No data to display  Initial Impression / Assessment and Plan / UC Course  I have reviewed the triage vital signs and the nursing notes.  Pertinent labs & imaging results that were available during my care of the patient were reviewed by me and considered in my medical decision making (see chart for details).  Covid test pending Set up with mychart for patient to view result Work note provided No questions or concerns at this time  Final Clinical Impressions(s) / UC Diagnoses   Final diagnoses:  Encounter for screening for COVID-19   Discharge Instructions   None    ED Prescriptions   None    PDMP not reviewed this encounter.   Marlow Baars, [place] 08/09/23 1149

## 2023-08-10 LAB — SARS CORONAVIRUS 2 (TAT 6-24 HRS): SARS Coronavirus 2: NEGATIVE

## 2023-10-01 ENCOUNTER — Ambulatory Visit (HOSPITAL_COMMUNITY)
Admission: EM | Admit: 2023-10-01 | Discharge: 2023-10-01 | Disposition: A | Discharge status: Home / Self Care | Payer: Medicaid Other

## 2023-10-01 ENCOUNTER — Other Ambulatory Visit: Payer: Self-pay

## 2023-10-01 ENCOUNTER — Encounter (HOSPITAL_COMMUNITY): Payer: Self-pay | Admitting: Emergency Medicine

## 2023-10-01 DIAGNOSIS — Z711 Person with feared health complaint in whom no diagnosis is made: Secondary | ICD-10-CM

## 2023-10-01 NOTE — ED Triage Notes (Addendum)
Patient was in jail last night.  The pre admission process includes a xray screening per patient.  They told her there was a dark spot on ovary.    Patient denies pain, but is worried at this point.

## 2023-10-01 NOTE — ED Provider Notes (Signed)
Patient evaluated briefly in triage for concern about possible ovarian problem. Patient was arrested and taken to jail last night where they performed an abdominal x-ray showing concern for dark spot on the right ovary.  Provider/radiology tech at the jail advised patient to follow-up on this with the provider. Patient denies history of PCOS/GYN problems. Denies chance of pregnancy, LMP September 14, 2023. She is asymptomatic and denies gross hematuria, vaginal symptoms, abdominal pain, pelvic pain, flank pain, fevers, chills, nausea, vomiting, diarrhea, and dizziness.  I am unable to review x-ray performed at jail that patient speaks of, however I have low suspicion for intra-abdominal pathology as she is currently asymptomatic. She may follow-up with her OB/GYN if she develops any new or worsening concerning symptoms. Discussed ER return precautions. Patient discharged from urgent care in stable condition from triage.   Carlisle Beers, [place] 10/01/23 1121

## 2023-10-23 ENCOUNTER — Ambulatory Visit: Payer: Medicaid Other | Admitting: Dermatology

## 2023-11-27 ENCOUNTER — Encounter (HOSPITAL_COMMUNITY): Payer: Self-pay | Admitting: Emergency Medicine

## 2023-11-27 ENCOUNTER — Telehealth (HOSPITAL_COMMUNITY): Payer: Self-pay

## 2023-11-27 ENCOUNTER — Ambulatory Visit (HOSPITAL_COMMUNITY)
Admission: EM | Admit: 2023-11-27 | Discharge: 2023-11-27 | Disposition: A | Discharge status: Home / Self Care | Payer: No Typology Code available for payment source | Attending: Family Medicine | Admitting: Family Medicine

## 2023-11-27 DIAGNOSIS — N760 Acute vaginitis: Secondary | ICD-10-CM | POA: Diagnosis not present

## 2023-11-27 MED ORDER — METRONIDAZOLE 500 MG PO TABS: 500.0000 mg | ORAL_TABLET | Freq: Two times a day (BID) | ORAL | 0 refills | Status: DC

## 2023-11-27 MED ORDER — METRONIDAZOLE 500 MG PO TABS: 500.0000 mg | ORAL_TABLET | Freq: Two times a day (BID) | ORAL | 0 refills | Status: AC

## 2023-11-27 NOTE — ED Provider Notes (Signed)
[facility]    CSN: [id number] Arrival date & time: 11/27/23  6160      History   Chief Complaint Chief Complaint  Patient presents with   Vaginal Discharge    HPI Erica Todd is a 27 y.o. female.    Vaginal Discharge Here for some vaginal dc and smell, bothering her off and on since August.  No abdominal pain and no dysuria.  No fever or chills  No itching  She did have a medical abortion around the time this started.  Last menstrual cycle was December 8 and her periods have been regular since August.  NKDA    Past Medical History:  Diagnosis Date   Headache(784.0)     Patient Active Problem List   Diagnosis Date Noted   Vaginal delivery 01/08/2022   Echogenic bowel of fetus on prenatal ultrasound 09/17/2021   Supervision of high risk pregnancy, antepartum 07/25/2021   Short interval between pregnancies complicating pregnancy, antepartum 07/25/2021   History of postpartum hypertension 07/25/2021   Tobacco smoking affecting pregnancy in third trimester 07/28/2020   History of FGR in prior pregnancy 07/28/2020   History of herpes genitalis 07/28/2020    Past Surgical History:  Procedure Laterality Date   NO PAST SURGERIES      OB History     Gravida  5   Para  3   Term  3   Preterm  0   AB  2   Living  3      SAB  1   IAB  1   Ectopic  0   Multiple  0   Live Births  3            Home Medications    Prior to Admission medications   Medication Sig Start Date End Date Taking? Authorizing Provider  metroNIDAZOLE (FLAGYL) 500 MG tablet Take 1 tablet (500 mg total) by mouth 2 (two) times daily for 7 days. 11/27/23 12/04/23 Yes Zenia Resides, MD    Family History Family History  Problem Relation Age of Onset   Diabetes Father    Alcohol abuse Neg Hx    Arthritis Neg Hx    Asthma Neg Hx    Birth defects Neg Hx    Cancer Neg Hx    COPD Neg Hx    Depression Neg Hx    Drug abuse Neg Hx    Early death Neg Hx     Hearing loss Neg Hx    Heart disease Neg Hx    Hyperlipidemia Neg Hx    Hypertension Neg Hx    Kidney disease Neg Hx    Learning disabilities Neg Hx    Mental illness Neg Hx    Mental retardation Neg Hx    Miscarriages / Stillbirths Neg Hx    Stroke Neg Hx    Vision loss Neg Hx    Varicose Veins Neg Hx     Social History Social History   Tobacco Use   Smoking status: Never   Smokeless tobacco: Never  Vaping Use   Vaping status: Never Used  Substance Use Topics   Alcohol use: Not Currently   Drug use: Not Currently    Frequency: 2.0 times per week    Types: Marijuana     Allergies   Patient has no known allergies.   Review of Systems Review of Systems  Genitourinary:  Positive for vaginal discharge.     Physical Exam Triage Vital Signs ED Triage Vitals  Encounter Vitals Group     BP 11/27/23 0817 118/76     Systolic BP Percentile --      Diastolic BP Percentile --      Pulse Rate 11/27/23 0817 62     Resp 11/27/23 0817 16     Temp 11/27/23 0817 98 F (36.7 C)     Temp Source 11/27/23 0817 Oral     SpO2 11/27/23 0817 97 %     Weight --      Height --      Head Circumference --      Peak Flow --      Pain Score 11/27/23 0815 0     Pain Loc --      Pain Education --      Exclude from Growth Chart --    No data found.  Updated Vital Signs BP 118/76 (BP Location: Left Arm)   Pulse 62   Temp 98 F (36.7 C) (Oral)   Resp 16   LMP 11/09/2023   SpO2 97%   Visual Acuity Right Eye Distance:   Left Eye Distance:   Bilateral Distance:    Right Eye Near:   Left Eye Near:    Bilateral Near:     Physical Exam Vitals reviewed.  Constitutional:      General: She is not in acute distress.    Appearance: She is not ill-appearing, toxic-appearing or diaphoretic.  HENT:     Mouth/Throat:     Mouth: Mucous membranes are moist.  Eyes:     Extraocular Movements: Extraocular movements intact.     Conjunctiva/sclera: Conjunctivae normal.      Pupils: Pupils are equal, round, and reactive to light.  Cardiovascular:     Rate and Rhythm: Normal rate and regular rhythm.  Pulmonary:     Effort: Pulmonary effort is normal.     Breath sounds: Normal breath sounds.  Abdominal:     Palpations: Abdomen is soft.     Tenderness: There is no abdominal tenderness.  Skin:    Coloration: Skin is not pale.  Neurological:     General: No focal deficit present.     Mental Status: She is alert and oriented to person, place, and time.  Psychiatric:        Behavior: Behavior normal.      UC Treatments / Results  Labs (all labs ordered are listed, but only abnormal results are displayed) Labs Reviewed  CERVICOVAGINAL ANCILLARY ONLY    EKG   Radiology No results found.  Procedures Procedures (including critical care time)  Medications Ordered in UC Medications - No data to display  Initial Impression / Assessment and Plan / UC Course  I have reviewed the triage vital signs and the nursing notes.  Pertinent labs & imaging results that were available during my care of the patient were reviewed by me and considered in my medical decision making (see chart for details).     Vaginal self swab is done, and we will notify of any positives on that and treat per protocol.  Metronidazole was sent in to treat empirically for BV.  She states this symptom seems most likely when she has had BV in the past. Final Clinical Impressions(s) / UC Diagnoses   Final diagnoses:  Acute vaginitis     Discharge Instructions      Staff will notify you if there is anything positive on the swab.  Take metronidazole 500 mg--1 tablet 2 times daily for 7 days.  Avoid drinking alcohol within 72 hours of taking this medication       ED Prescriptions     Medication Sig Dispense Auth. Provider   metroNIDAZOLE (FLAGYL) 500 MG tablet Take 1 tablet (500 mg total) by mouth 2 (two) times daily for 7 days. 14 tablet Katena Petitjean, Janace Aris, MD       PDMP not reviewed this encounter.   Zenia Resides, MD 11/27/23 [phone]

## 2023-11-27 NOTE — Telephone Encounter (Signed)
Informed by front desk staff: "Patient called requesting for rx to be re sent to [facility] on [address]. The one on [address] is closed today. [facility])"  Patient seen today by Dr Marlinda Mike. Medication sent in to preferred pharmacy per protocol.

## 2023-11-27 NOTE — ED Triage Notes (Signed)
Pt reports had an abortion in August (pill version) and had vaginal discharge, odor and "irritation since" that has been intermittent". Reports irritation is pain "inside and in know something is wrong".  LMP 12/8 Denies any urinary problems.

## 2023-11-27 NOTE — Discharge Instructions (Signed)
Staff will notify you if there is anything positive on the swab  Take metronidazole 500 mg--1 tablet 2 times daily for 7 days.  Avoid drinking alcohol within 72 hours of taking this medication

## 2023-11-28 LAB — CERVICOVAGINAL ANCILLARY ONLY
Bacterial Vaginitis (gardnerella): POSITIVE — AB
Candida Glabrata: NEGATIVE
Candida Vaginitis: NEGATIVE
Chlamydia: NEGATIVE
Comment: NEGATIVE
Comment: NEGATIVE
Comment: NEGATIVE
Comment: NEGATIVE
Comment: NEGATIVE
Comment: NORMAL
Neisseria Gonorrhea: NEGATIVE
Trichomonas: NEGATIVE

## 2024-02-25 ENCOUNTER — Encounter (HOSPITAL_COMMUNITY): Payer: Self-pay | Admitting: Emergency Medicine

## 2024-02-25 ENCOUNTER — Other Ambulatory Visit: Payer: Self-pay

## 2024-02-25 ENCOUNTER — Ambulatory Visit (HOSPITAL_COMMUNITY)
Admission: EM | Admit: 2024-02-25 | Discharge: 2024-02-25 | Disposition: A | Discharge status: Home / Self Care | Attending: Emergency Medicine | Admitting: Emergency Medicine

## 2024-02-25 DIAGNOSIS — N898 Other specified noninflammatory disorders of vagina: Secondary | ICD-10-CM

## 2024-02-25 DIAGNOSIS — Z113 Encounter for screening for infections with a predominantly sexual mode of transmission: Secondary | ICD-10-CM

## 2024-02-25 LAB — CERVICOVAGINAL ANCILLARY ONLY
Bacterial Vaginitis (gardnerella): POSITIVE — AB
Candida Glabrata: NEGATIVE
Candida Vaginitis: NEGATIVE
Chlamydia: NEGATIVE
Comment: NEGATIVE
Comment: NEGATIVE
Comment: NEGATIVE
Comment: NEGATIVE
Comment: NEGATIVE
Comment: NORMAL
Neisseria Gonorrhea: NEGATIVE
Trichomonas: NEGATIVE

## 2024-02-25 LAB — POCT URINALYSIS DIP (MANUAL ENTRY)
Bilirubin, UA: NEGATIVE
Blood, UA: NEGATIVE
Glucose, UA: NEGATIVE mg/dL
Leukocytes, UA: NEGATIVE
Nitrite, UA: NEGATIVE
Protein Ur, POC: NEGATIVE mg/dL
Spec Grav, UA: 1.015 (ref 1.010–1.025)
Urobilinogen, UA: 0.2 U/dL
pH, UA: 6 (ref 5.0–8.0)

## 2024-02-25 LAB — POCT URINE PREGNANCY: Preg Test, Ur: NEGATIVE

## 2024-02-25 MED ORDER — METRONIDAZOLE 500 MG PO TABS: 500.0000 mg | ORAL_TABLET | Freq: Two times a day (BID) | ORAL | 0 refills | Status: DC

## 2024-02-25 NOTE — Discharge Instructions (Addendum)
 You have been treated empirically for bacterial vaginosis with Flagyl.  Take this twice a day until finished, take it with food to prevent stomach upset.  Do not drink alcohol on this medication, as it can make you very sick.  Missing from intercourse until all results have been received.  Our staff will contact you if any additional treatment is needed.  Return to clinic for any new or urgent symptoms.

## 2024-02-25 NOTE — ED Triage Notes (Signed)
 Vaginal discharge and odor for a month. denies abdominal pain or back pain.  Denies urinary symptoms

## 2024-02-25 NOTE — ED Provider Notes (Signed)
 [facility]    CSN: [id number] Arrival date & time: 02/25/24  0806      History   Chief Complaint Chief Complaint  Patient presents with   Vaginal Discharge    HPI Erica Todd is a 28 y.o. female.   Patient presents to clinic over concern for a fishy vaginal odor and increased vaginal discharge that has been present for approximately the past month.  She is not having abdominal pain, nausea, vomiting, fevers, flank pain or urinary symptoms.  Denies hematuria, urgency or frequency.  Last menstrual cycle 3/15.  Last unprotected intercourse was about 2 months prior.  Has had bacterial vaginosis in the past.  The history is provided by the patient and medical records.  Vaginal Discharge   Past Medical History:  Diagnosis Date   Headache(784.0)     Patient Active Problem List   Diagnosis Date Noted   Vaginal delivery 01/08/2022   Echogenic bowel of fetus on prenatal ultrasound 09/17/2021   Supervision of high risk pregnancy, antepartum 07/25/2021   Short interval between pregnancies complicating pregnancy, antepartum 07/25/2021   History of postpartum hypertension 07/25/2021   Tobacco smoking affecting pregnancy in third trimester 07/28/2020   History of FGR in prior pregnancy 07/28/2020   History of herpes genitalis 07/28/2020    Past Surgical History:  Procedure Laterality Date   NO PAST SURGERIES      OB History     Gravida  5   Para  3   Term  3   Preterm  0   AB  2   Living  3      SAB  1   IAB  1   Ectopic  0   Multiple  0   Live Births  3            Home Medications    Prior to Admission medications   Medication Sig Start Date End Date Taking? Authorizing Provider  metroNIDAZOLE (FLAGYL) 500 MG tablet Take 1 tablet (500 mg total) by mouth 2 (two) times daily. 02/25/24  Yes [place] Wenzlick, [place] N, FNP    Family History Family History  Problem Relation Age of Onset   Diabetes Father    Alcohol abuse Neg Hx     Arthritis Neg Hx    Asthma Neg Hx    Birth defects Neg Hx    Cancer Neg Hx    COPD Neg Hx    Depression Neg Hx    Drug abuse Neg Hx    Early death Neg Hx    Hearing loss Neg Hx    Heart disease Neg Hx    Hyperlipidemia Neg Hx    Hypertension Neg Hx    Kidney disease Neg Hx    Learning disabilities Neg Hx    Mental illness Neg Hx    Mental retardation Neg Hx    Miscarriages / Stillbirths Neg Hx    Stroke Neg Hx    Vision loss Neg Hx    Varicose Veins Neg Hx     Social History Social History   Tobacco Use   Smoking status: Every Day    Types: Cigarettes   Smokeless tobacco: Never  Vaping Use   Vaping status: Never Used  Substance Use Topics   Alcohol use: Not Currently   Drug use: Not Currently    Frequency: 2.0 times per week    Types: Marijuana     Allergies   Patient has no known allergies.   Review of Systems  Review of Systems  Per HPI  Physical Exam Triage Vital Signs ED Triage Vitals  Encounter Vitals Group     BP 02/25/24 0827 107/72     Systolic BP Percentile --      Diastolic BP Percentile --      Pulse Rate 02/25/24 0827 67     Resp 02/25/24 0827 18     Temp 02/25/24 0827 98.2 F (36.8 C)     Temp Source 02/25/24 0827 Oral     SpO2 02/25/24 0827 97 %     Weight --      Height --      Head Circumference --      Peak Flow --      Pain Score 02/25/24 0826 0     Pain Loc --      Pain Education --      Exclude from Growth Chart --    No data found.  Updated Vital Signs BP 107/72 (BP Location: Left Arm)   Pulse 67   Temp 98.2 F (36.8 C) (Oral)   Resp 18   LMP 02/14/2024   SpO2 97%   Visual Acuity Right Eye Distance:   Left Eye Distance:   Bilateral Distance:    Right Eye Near:   Left Eye Near:    Bilateral Near:     Physical Exam Vitals and nursing note reviewed.  Constitutional:      Appearance: Normal appearance.  HENT:     Head: Normocephalic and atraumatic.     Right Ear: External ear normal.     Left Ear:  External ear normal.     Nose: Nose normal.     Mouth/Throat:     Mouth: Mucous membranes are moist.  Eyes:     General: No scleral icterus. Pulmonary:     Effort: Pulmonary effort is normal. No respiratory distress.  Neurological:     General: No focal deficit present.     Mental Status: She is alert.  Psychiatric:        Mood and Affect: Mood normal.      UC Treatments / Results  Labs (all labs ordered are listed, but only abnormal results are displayed) Labs Reviewed  POCT URINALYSIS DIP (MANUAL ENTRY) - Abnormal; Notable for the following components:      Result Value   Clarity, UA cloudy (*)    Ketones, POC UA trace (5) (*)    All other components within normal limits  POCT URINE PREGNANCY  CERVICOVAGINAL ANCILLARY ONLY    EKG   Radiology No results found.  Procedures Procedures (including critical care time)  Medications Ordered in UC Medications - No data to display  Initial Impression / Assessment and Plan / UC Course  I have reviewed the triage vital signs and the nursing notes.  Pertinent labs & imaging results that were available during my care of the patient were reviewed by me and considered in my medical decision making (see chart for details).  Vitals and triage reviewed, patient is hemodynamically stable.  Without urinary symptoms, urinalysis unremarkable.  Urine pregnancy negative.  Symptoms consistent with bacterial vaginosis, will treat empirically.  Cytology swab obtained and staff will contact if additional treatment is needed.  Plan of care, follow-up care return precautions given, no questions at this time.     Final Clinical Impressions(s) / UC Diagnoses   Final diagnoses:  Vaginal discharge  Encounter for screening for bacterial sexually transmitted infection     Discharge Instructions  You have been treated empirically for bacterial vaginosis with Flagyl.  Take this twice a day until finished, take it with food to prevent  stomach upset.  Do not drink alcohol on this medication, as it can make you very sick.  Missing from intercourse until all results have been received.  Our staff will contact you if any additional treatment is needed.  Return to clinic for any new or urgent symptoms.    ED Prescriptions     Medication Sig Dispense Auth. Provider   metroNIDAZOLE (FLAGYL) 500 MG tablet Take 1 tablet (500 mg total) by mouth 2 (two) times daily. 14 tablet Siddhanth Denk, [place], [place]      PDMP not reviewed this encounter.   Vic Esco, [place] N, [place] 02/25/24 0900

## 2024-05-04 ENCOUNTER — Encounter (HOSPITAL_COMMUNITY): Payer: Self-pay | Admitting: *Deleted

## 2024-05-04 ENCOUNTER — Ambulatory Visit (HOSPITAL_COMMUNITY)
Admission: EM | Admit: 2024-05-04 | Discharge: 2024-05-04 | Disposition: A | Discharge status: Home / Self Care | Attending: Nurse Practitioner | Admitting: Nurse Practitioner

## 2024-05-04 DIAGNOSIS — Z3202 Encounter for pregnancy test, result negative: Secondary | ICD-10-CM | POA: Insufficient documentation

## 2024-05-04 DIAGNOSIS — N898 Other specified noninflammatory disorders of vagina: Secondary | ICD-10-CM | POA: Insufficient documentation

## 2024-05-04 LAB — POCT URINE PREGNANCY: Preg Test, Ur: NEGATIVE

## 2024-05-04 MED ORDER — METRONIDAZOLE 500 MG PO TABS
500.0000 mg | ORAL_TABLET | Freq: Two times a day (BID) | ORAL | 0 refills | Status: AC
Start: 2024-05-04 — End: 2024-05-11

## 2024-05-04 NOTE — ED Notes (Signed)
 Pt declined UPT

## 2024-05-04 NOTE — ED Provider Notes (Signed)
 [facility]    CSN: [id number] Arrival date & time: 05/04/24  1101      History   Chief Complaint Chief Complaint  Patient presents with   Vaginal Discharge    HPI Erica Todd is a 28 y.o. female.   Patient presents today with 1 month history of thin, discolored vaginal discharge that has a fishy odor.  She denies any vaginal rashes, sores, lesions, abdominal pain, pelvic pain, fever, nausea/vomiting, or groin swelling.  Reports she was treated for BV 2 months ago and the symptoms improved for a couple of weeks but then recurred after her last menstrual cycle.  She denies unprotected sexual intercourse since her last menstrual cycle.  She is worried she may have trichomonas.  Denies known exposures to STI.    Past Medical History:  Diagnosis Date   Headache(784.0)     Patient Active Problem List   Diagnosis Date Noted   Vaginal delivery 01/08/2022   Echogenic bowel of fetus on prenatal ultrasound 09/17/2021   Supervision of high risk pregnancy, antepartum 07/25/2021   Short interval between pregnancies complicating pregnancy, antepartum 07/25/2021   History of postpartum hypertension 07/25/2021   Tobacco smoking affecting pregnancy in third trimester 07/28/2020   History of FGR in prior pregnancy 07/28/2020   History of herpes genitalis 07/28/2020    Past Surgical History:  Procedure Laterality Date   NO PAST SURGERIES      OB History     Gravida  5   Para  3   Term  3   Preterm  0   AB  2   Living  3      SAB  1   IAB  1   Ectopic  0   Multiple  0   Live Births  3            Home Medications    Prior to Admission medications   Medication Sig Start Date End Date Taking? Authorizing Provider  metroNIDAZOLE  (FLAGYL ) 500 MG tablet Take 1 tablet (500 mg total) by mouth 2 (two) times daily for 7 days. 05/04/24 05/11/24  Wilhemena Harbour, NP    Family History Family History  Problem Relation Age of Onset   Diabetes Father     Alcohol  abuse Neg Hx    Arthritis Neg Hx    Asthma Neg Hx    Birth defects Neg Hx    Cancer Neg Hx    COPD Neg Hx    Depression Neg Hx    Drug abuse Neg Hx    Early death Neg Hx    Hearing loss Neg Hx    Heart disease Neg Hx    Hyperlipidemia Neg Hx    Hypertension Neg Hx    Kidney disease Neg Hx    Learning disabilities Neg Hx    Mental illness Neg Hx    Mental retardation Neg Hx    Miscarriages / Stillbirths Neg Hx    Stroke Neg Hx    Vision loss Neg Hx    Varicose Veins Neg Hx     Social History Social History   Tobacco Use   Smoking status: Every Day    Types: Cigarettes   Smokeless tobacco: Never  Vaping Use   Vaping status: Never Used  Substance Use Topics   Alcohol  use: Not Currently   Drug use: Not Currently    Frequency: 2.0 times per week    Types: Marijuana     Allergies   Patient  has no known allergies.   Review of Systems Review of Systems Per HPI  Physical Exam Triage Vital Signs ED Triage Vitals [05/04/24 1152]  Encounter Vitals Group     BP 107/61     Systolic BP Percentile      Diastolic BP Percentile      Pulse Rate 62     Resp 18     Temp 97.9 F (36.6 C)     Temp Source Oral     SpO2 96 %     Weight      Height      Head Circumference      Peak Flow      Pain Score 0     Pain Loc      Pain Education      Exclude from Growth Chart    No data found.  Updated Vital Signs BP 107/61 (BP Location: Right Arm)   Pulse 62   Temp 97.9 F (36.6 C) (Oral)   Resp 18   LMP 04/05/2024 (Approximate)   SpO2 96%   Visual Acuity Right Eye Distance:   Left Eye Distance:   Bilateral Distance:    Right Eye Near:   Left Eye Near:    Bilateral Near:     Physical Exam Vitals and nursing note reviewed.  Constitutional:      General: She is not in acute distress.    Appearance: Normal appearance. She is not toxic-appearing.  Pulmonary:     Effort: Pulmonary effort is normal. No respiratory distress.  Genitourinary:     Comments: Deferred - self swab performed by patient Skin:    General: Skin is warm and dry.     Coloration: Skin is not jaundiced or pale.     Findings: No erythema.  Neurological:     Mental Status: She is alert and oriented to person, place, and time.     Motor: No weakness.     Gait: Gait normal.  Psychiatric:        Mood and Affect: Mood normal.        Behavior: Behavior is cooperative.      UC Treatments / Results  Labs (all labs ordered are listed, but only abnormal results are displayed) Labs Reviewed  POCT URINE PREGNANCY - Normal  CERVICOVAGINAL ANCILLARY ONLY    EKG   Radiology No results found.  Procedures Procedures (including critical care time)  Medications Ordered in UC Medications - No data to display  Initial Impression / Assessment and Plan / UC Course  I have reviewed the triage vital signs and the nursing notes.  Pertinent labs & imaging results that were available during my care of the patient were reviewed by me and considered in my medical decision making (see chart for details).   Patient is well-appearing, normotensive, afebrile, not tachycardic, not tachypneic, oxygenating well on room air.   1. Vaginal discharge 2. Urine pregnancy test negative Will treat for bacterial vaginosis with metronidazole  500 mg twice daily for 7 days today Cytology is pending-treat as indicated if anything else positive  The patient was given the opportunity to ask questions.  All questions answered to their satisfaction.  The patient is in agreement to this plan.   Final Clinical Impressions(s) / UC Diagnoses   Final diagnoses:  Vaginal discharge  Urine pregnancy test negative     Discharge Instructions      We are treating you today for BV.  Take the metronidazole  as prescribed to treat it.  We will contact you if the testing comes back positive for anything else today.  Recommend condom use with every sexual encounter going forward.  ED  Prescriptions     Medication Sig Dispense Auth. Provider   metroNIDAZOLE  (FLAGYL ) 500 MG tablet Take 1 tablet (500 mg total) by mouth 2 (two) times daily for 7 days. 14 tablet Wilhemena Harbour, NP      PDMP not reviewed this encounter.   Wilhemena Harbour, NP 05/04/24 1235

## 2024-05-04 NOTE — Discharge Instructions (Addendum)
 We are treating you today for BV.  Take the metronidazole  as prescribed to treat it.  We will contact you if the testing comes back positive for anything else today.  Recommend condom use with every sexual encounter going forward.

## 2024-05-04 NOTE — ED Triage Notes (Signed)
 Pt states that she has had discolored vaginal discharge and a fishy smell on and off X 2 months. She was treated back in march but doesn't think it helped at all.

## 2024-05-05 LAB — CERVICOVAGINAL ANCILLARY ONLY
Bacterial Vaginitis (gardnerella): POSITIVE — AB
Candida Glabrata: NEGATIVE
Candida Vaginitis: NEGATIVE
Chlamydia: NEGATIVE
Comment: NEGATIVE
Comment: NEGATIVE
Comment: NEGATIVE
Comment: NEGATIVE
Comment: NEGATIVE
Comment: NORMAL
Neisseria Gonorrhea: NEGATIVE
Trichomonas: NEGATIVE

## 2024-05-07 ENCOUNTER — Ambulatory Visit (HOSPITAL_COMMUNITY): Payer: Self-pay

## 2024-05-18 ENCOUNTER — Ambulatory Visit (HOSPITAL_COMMUNITY)
Admission: EM | Admit: 2024-05-18 | Discharge: 2024-05-18 | Disposition: A | Discharge status: Home / Self Care | Attending: Family Medicine | Admitting: Family Medicine

## 2024-05-18 ENCOUNTER — Encounter (HOSPITAL_COMMUNITY): Payer: Self-pay

## 2024-05-18 DIAGNOSIS — Z20822 Contact with and (suspected) exposure to covid-19: Secondary | ICD-10-CM

## 2024-05-18 LAB — POC SARS CORONAVIRUS 2 AG -  ED: SARS Coronavirus 2 Ag: NEGATIVE

## 2024-05-18 NOTE — ED Provider Notes (Signed)
 [facility]    CSN: [id number] Arrival date & time: 05/18/24  0810      History   Chief Complaint Chief Complaint  Patient presents with   Covid Screening    HPI Erica Todd is a 28 y.o. female.   HPI Here for exposure to COVID.  2 days ago she was in close contact with someone who later told her they had tested positive for COVID.  That person had had some cough when they were together.  This patient has not had any sore throat or cough or nasal congestion or fever.   she would like to be tested as she is about to start a new job.    Last menstrual cycle was June 5  NKDA Past Medical History:  Diagnosis Date   Headache(784.0)     Patient Active Problem List   Diagnosis Date Noted   Vaginal delivery 01/08/2022   Echogenic bowel of fetus on prenatal ultrasound 09/17/2021   Supervision of high risk pregnancy, antepartum 07/25/2021   Short interval between pregnancies complicating pregnancy, antepartum 07/25/2021   History of postpartum hypertension 07/25/2021   Tobacco smoking affecting pregnancy in third trimester 07/28/2020   History of FGR in prior pregnancy 07/28/2020   History of herpes genitalis 07/28/2020    Past Surgical History:  Procedure Laterality Date   NO PAST SURGERIES      OB History     Gravida  5   Para  3   Term  3   Preterm  0   AB  2   Living  3      SAB  1   IAB  1   Ectopic  0   Multiple  0   Live Births  3            Home Medications    Prior to Admission medications   Not on File    Family History Family History  Problem Relation Age of Onset   Diabetes Father    Alcohol  abuse Neg Hx    Arthritis Neg Hx    Asthma Neg Hx    Birth defects Neg Hx    Cancer Neg Hx    COPD Neg Hx    Depression Neg Hx    Drug abuse Neg Hx    Early death Neg Hx    Hearing loss Neg Hx    Heart disease Neg Hx    Hyperlipidemia Neg Hx    Hypertension Neg Hx    Kidney disease Neg Hx    Learning  disabilities Neg Hx    Mental illness Neg Hx    Mental retardation Neg Hx    Miscarriages / Stillbirths Neg Hx    Stroke Neg Hx    Vision loss Neg Hx    Varicose Veins Neg Hx     Social History Social History   Tobacco Use   Smoking status: Some Days    Types: Cigarettes   Smokeless tobacco: Never  Vaping Use   Vaping status: Never Used  Substance Use Topics   Alcohol  use: Not Currently   Drug use: Not Currently    Frequency: 2.0 times per week    Types: Marijuana     Allergies   Patient has no known allergies.   Review of Systems Review of Systems   Physical Exam Triage Vital Signs ED Triage Vitals  Encounter Vitals Group     BP 05/18/24 0829 109/72     Girls Systolic BP  Percentile --      Girls Diastolic BP Percentile --      Boys Systolic BP Percentile --      Boys Diastolic BP Percentile --      Pulse Rate 05/18/24 0829 (!) 56     Resp 05/18/24 0829 16     Temp 05/18/24 0829 98.5 F (36.9 C)     Temp Source 05/18/24 0829 Oral     SpO2 05/18/24 0829 99 %     Weight --      Height --      Head Circumference --      Peak Flow --      Pain Score 05/18/24 0828 0     Pain Loc --      Pain Education --      Exclude from Growth Chart --    No data found.  Updated Vital Signs BP 109/72 (BP Location: Right Arm)   Pulse (!) 56   Temp 98.5 F (36.9 C) (Oral)   Resp 16   LMP 05/06/2024 (Approximate)   SpO2 99%   Visual Acuity Right Eye Distance:   Left Eye Distance:   Bilateral Distance:    Right Eye Near:   Left Eye Near:    Bilateral Near:     Physical Exam Vitals reviewed.  Constitutional:      General: She is not in acute distress.    Appearance: She is not toxic-appearing.  HENT:     Mouth/Throat:     Mouth: Mucous membranes are moist.     Pharynx: No oropharyngeal exudate or posterior oropharyngeal erythema.   Cardiovascular:     Rate and Rhythm: Normal rate and regular rhythm.     Heart sounds: No murmur heard. Pulmonary:      Effort: Pulmonary effort is normal. No respiratory distress.     Breath sounds: Normal breath sounds. No stridor. No wheezing, rhonchi or rales.   Skin:    Coloration: Skin is not pale.   Neurological:     Mental Status: She is alert and oriented to person, place, and time.   Psychiatric:        Behavior: Behavior normal.      UC Treatments / Results  Labs (all labs ordered are listed, but only abnormal results are displayed) Labs Reviewed  POC SARS CORONAVIRUS 2 AG -  ED    EKG   Radiology No results found.  Procedures Procedures (including critical care time)  Medications Ordered in UC Medications - No data to display  Initial Impression / Assessment and Plan / UC Course  I have reviewed the triage vital signs and the nursing notes.  Pertinent labs & imaging results that were available during my care of the patient were reviewed by me and considered in my medical decision making (see chart for details).     Point-of-care COVID test is negative. Work note is provided for today  Final Clinical Impressions(s) / UC Diagnoses   Final diagnoses:  Exposure to COVID-19 virus     Discharge Instructions      Your COVID test was negative.     ED Prescriptions   None    PDMP not reviewed this encounter.   Ann Keto, MD 05/18/24 [phone]

## 2024-05-18 NOTE — ED Triage Notes (Signed)
 Patient would like to have a Covid screening prior to starting a new job. Patient states that she was also exposed to someone who had Covid. Patient denies symptoms.

## 2024-05-18 NOTE — Discharge Instructions (Signed)
Your COVID test was negative.

## 2024-09-17 ENCOUNTER — Ambulatory Visit (HOSPITAL_COMMUNITY)
Admission: RE | Admit: 2024-09-17 | Discharge: 2024-09-17 | Disposition: A | Discharge status: Home / Self Care | Source: Ambulatory Visit | Attending: Emergency Medicine | Admitting: Emergency Medicine

## 2024-09-17 ENCOUNTER — Encounter (HOSPITAL_COMMUNITY): Payer: Self-pay

## 2024-09-17 VITALS — BP 114/59 | HR 58 | Temp 97.8°F | Resp 16

## 2024-09-17 DIAGNOSIS — N939 Abnormal uterine and vaginal bleeding, unspecified: Secondary | ICD-10-CM | POA: Diagnosis not present

## 2024-09-17 DIAGNOSIS — Z3202 Encounter for pregnancy test, result negative: Secondary | ICD-10-CM

## 2024-09-17 LAB — POCT URINE PREGNANCY: Preg Test, Ur: NEGATIVE

## 2024-09-17 NOTE — ED Triage Notes (Signed)
 Pt reports in August had a medication abortion. Had first menstrual cycle beginning of this month. Today started having vaginal bleeding. Reports that flow is very heavy and changing pad every hour.  Reports lite cramping. Denies any at this time

## 2024-09-17 NOTE — Discharge Instructions (Signed)
 Please call the woman's clinic to make an appointment for follow-up  In the meantime monitor for any change in symptoms.  If you suddenly develop severe pain, dizziness or shortness of breath, or start saturating a sanitary pad every hour, go to the emergency department.

## 2024-09-17 NOTE — ED Provider Notes (Signed)
 [facility]    CSN: [id number] Arrival date & time: 09/17/24  1800     History   Chief Complaint Chief Complaint  Patient presents with   Appointment    HPI Erica Todd is a 28 y.o. female.  Vaginal bleeding started today Heavier than her usual cycles. Minimal abdominal cramping. Not passing any clots.  She is able to wear a pad for several hours before needing to change it  Recent pill abortion in August. No cycle in September. Had her normal cycle beginning of October that ended, and then this bleeding started today   Past Medical History:  Diagnosis Date   Headache(784.0)     Patient Active Problem List   Diagnosis Date Noted   Vaginal delivery 01/08/2022   Echogenic bowel of fetus on prenatal ultrasound 09/17/2021   Supervision of high risk pregnancy, antepartum 07/25/2021   Short interval between pregnancies complicating pregnancy, antepartum 07/25/2021   History of postpartum hypertension 07/25/2021   Tobacco smoking affecting pregnancy in third trimester 07/28/2020   History of FGR in prior pregnancy 07/28/2020   History of herpes genitalis 07/28/2020    Past Surgical History:  Procedure Laterality Date   NO PAST SURGERIES      OB History     Gravida  5   Para  3   Term  3   Preterm  0   AB  2   Living  3      SAB  1   IAB  1   Ectopic  0   Multiple  0   Live Births  3            Home Medications    Prior to Admission medications   Not on File    Family History Family History  Problem Relation Age of Onset   Diabetes Father    Alcohol  abuse Neg Hx    Arthritis Neg Hx    Asthma Neg Hx    Birth defects Neg Hx    Cancer Neg Hx    COPD Neg Hx    Depression Neg Hx    Drug abuse Neg Hx    Early death Neg Hx    Hearing loss Neg Hx    Heart disease Neg Hx    Hyperlipidemia Neg Hx    Hypertension Neg Hx    Kidney disease Neg Hx    Learning disabilities Neg Hx    Mental illness Neg Hx    Mental  retardation Neg Hx    Miscarriages / Stillbirths Neg Hx    Stroke Neg Hx    Vision loss Neg Hx    Varicose Veins Neg Hx     Social History Social History   Tobacco Use   Smoking status: Some Days    Types: Cigarettes   Smokeless tobacco: Never  Vaping Use   Vaping status: Never Used  Substance Use Topics   Alcohol  use: Not Currently   Drug use: Not Currently    Frequency: 2.0 times per week    Types: Marijuana     Allergies   Patient has no known allergies.   Review of Systems Review of Systems  As per HPI  Physical Exam Triage Vital Signs ED Triage Vitals  Encounter Vitals Group     BP 09/17/24 1831 (!) 114/59     Girls Systolic BP Percentile --      Girls Diastolic BP Percentile --      Boys Systolic BP Percentile --  Boys Diastolic BP Percentile --      Pulse Rate 09/17/24 1831 (!) 58     Resp 09/17/24 1831 16     Temp 09/17/24 1831 97.8 F (36.6 C)     Temp Source 09/17/24 1831 Oral     SpO2 09/17/24 1831 95 %     Weight --      Height --      Head Circumference --      Peak Flow --      Pain Score 09/17/24 1830 0     Pain Loc --      Pain Education --      Exclude from Growth Chart --    No data found.  Updated Vital Signs BP (!) 114/59 (BP Location: Left Arm)   Pulse (!) 58   Temp 97.8 F (36.6 C) (Oral)   Resp 16   LMP 09/02/2024 (Approximate)   SpO2 95%   Visual Acuity Right Eye Distance:   Left Eye Distance:   Bilateral Distance:    Right Eye Near:   Left Eye Near:    Bilateral Near:     Physical Exam Vitals and nursing note reviewed.  Constitutional:      Appearance: Normal appearance.  HENT:     Mouth/Throat:     Mouth: Mucous membranes are moist.     Pharynx: Oropharynx is clear.  Eyes:     Conjunctiva/sclera: Conjunctivae normal.  Cardiovascular:     Rate and Rhythm: Normal rate and regular rhythm.     Pulses: Normal pulses.     Heart sounds: Normal heart sounds.  Pulmonary:     Effort: Pulmonary effort is  normal.     Breath sounds: Normal breath sounds.  Abdominal:     General: Bowel sounds are normal.     Palpations: Abdomen is soft.     Tenderness: There is no abdominal tenderness. There is no right CVA tenderness, guarding or rebound.  Musculoskeletal:        General: Normal range of motion.  Skin:    General: Skin is warm and dry.  Neurological:     Mental Status: She is alert and oriented to person, place, and time.     UC Treatments / Results  Labs (all labs ordered are listed, but only abnormal results are displayed) Labs Reviewed  POCT URINE PREGNANCY - Normal    EKG   Radiology No results found.  Procedures Procedures (including critical care time)  Medications Ordered in UC Medications - No data to display  Initial Impression / Assessment and Plan / UC Course  I have reviewed the triage vital signs and the nursing notes.  Pertinent labs & imaging results that were available during my care of the patient were reviewed by me and considered in my medical decision making (see chart for details).  Stable vials. Well appearing  UPT negative No red flags. Heavier bleeding than she is used to but not hemorrhage. Reassurance provided. Monitor symptoms - hormones likely thrown off from recent abortion. Follow up with ob/gyn. ED precautions are discussed  Final Clinical Impressions(s) / UC Diagnoses   Final diagnoses:  Abnormal uterine bleeding     Discharge Instructions      Please call the woman's clinic to make an appointment for follow-up  In the meantime monitor for any change in symptoms.  If you suddenly develop severe pain, dizziness or shortness of breath, or start saturating a sanitary pad every hour, go to the emergency department.  ED Prescriptions   None    PDMP not reviewed this encounter.   Zykiria Bruening, [place] RIGGERS 09/17/24 1920

## 2024-10-03 ENCOUNTER — Encounter (HOSPITAL_COMMUNITY): Payer: Self-pay

## 2024-10-03 ENCOUNTER — Emergency Department (HOSPITAL_COMMUNITY)

## 2024-10-03 ENCOUNTER — Emergency Department (HOSPITAL_COMMUNITY)
Admission: EM | Admit: 2024-10-03 | Discharge: 2024-10-03 | Disposition: A | Discharge status: Home / Self Care | Source: Ambulatory Visit | Attending: Emergency Medicine | Admitting: Emergency Medicine

## 2024-10-03 ENCOUNTER — Ambulatory Visit (HOSPITAL_COMMUNITY): Admission: EM | Admit: 2024-10-03 | Discharge: 2024-10-03 | Disposition: A | Discharge status: Home / Self Care

## 2024-10-03 ENCOUNTER — Encounter (HOSPITAL_COMMUNITY): Payer: Self-pay | Admitting: *Deleted

## 2024-10-03 ENCOUNTER — Other Ambulatory Visit: Payer: Self-pay

## 2024-10-03 DIAGNOSIS — R519 Headache, unspecified: Secondary | ICD-10-CM | POA: Insufficient documentation

## 2024-10-03 DIAGNOSIS — Y92410 Unspecified street and highway as the place of occurrence of the external cause: Secondary | ICD-10-CM | POA: Insufficient documentation

## 2024-10-03 DIAGNOSIS — S0990XA Unspecified injury of head, initial encounter: Secondary | ICD-10-CM | POA: Diagnosis not present

## 2024-10-03 DIAGNOSIS — R0781 Pleurodynia: Secondary | ICD-10-CM | POA: Diagnosis not present
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
